# Patient Record
Sex: Male | Born: 2000 | Race: White | Hispanic: No | Marital: Single | Smoking: Never smoker
Health system: Southern US, Community
[De-identification: ages and names within clinical notes are randomized; demographics above are authoritative.]

## PROBLEM LIST (undated history)

## (undated) DIAGNOSIS — J45909 Unspecified asthma, uncomplicated: Secondary | ICD-10-CM

## (undated) DIAGNOSIS — F32A Depression, unspecified: Secondary | ICD-10-CM

## (undated) DIAGNOSIS — F913 Oppositional defiant disorder: Secondary | ICD-10-CM

## (undated) DIAGNOSIS — F909 Attention-deficit hyperactivity disorder, unspecified type: Secondary | ICD-10-CM

---

## 2020-04-15 ENCOUNTER — Ambulatory Visit: Payer: Medicaid Other | Attending: Internal Medicine

## 2020-04-15 DIAGNOSIS — Z23 Encounter for immunization: Secondary | ICD-10-CM

## 2020-04-15 NOTE — Progress Notes (Signed)
   Covid-19 Vaccination Clinic  Name:  Derrick Nguyen    MRN: [id number] DOB: 11-06-2001  04/15/2020  [name] was observed post Covid-19 immunization for 15 minutes without incident. He was provided with Vaccine Information Sheet and instruction to access the V-Safe system.   [name] was instructed to call 911 with any severe reactions post vaccine: Marland Kitchen Difficulty breathing  . Swelling of face and throat  . A fast heartbeat  . A bad rash all over body  . Dizziness and weakness   Immunizations Administered    Name Date Dose VIS Date Route   Pfizer COVID-19 Vaccine 04/15/2020  9:36 AM 0.3 mL 02/18/2019 Intramuscular   Manufacturer: [employer], [employer]   Lot: [id number]   NDC: [address]-4

## 2020-05-10 ENCOUNTER — Ambulatory Visit: Payer: Medicaid Other | Attending: Internal Medicine

## 2020-05-10 DIAGNOSIS — Z23 Encounter for immunization: Secondary | ICD-10-CM

## 2020-05-10 NOTE — Progress Notes (Signed)
   Covid-19 Vaccination Clinic  Name:  Derrick Nguyen    MRN: [id number] DOB: 2001-02-24  05/10/2020  [name] was observed post Covid-19 immunization for 15 minutes without incident. He was provided with Vaccine Information Sheet and instruction to access the V-Safe system.   [name] was instructed to call 911 with any severe reactions post vaccine: Marland Kitchen Difficulty breathing  . Swelling of face and throat  . A fast heartbeat  . A bad rash all over body  . Dizziness and weakness   Immunizations Administered    Name Date Dose VIS Date Route   Pfizer COVID-19 Vaccine 05/10/2020 10:40 AM 0.3 mL 02/18/2019 Intramuscular   Manufacturer: [employer], [employer]   Lot: [id number]   NDC: [address]-7

## 2020-06-13 ENCOUNTER — Ambulatory Visit (HOSPITAL_COMMUNITY)
Admission: EM | Admit: 2020-06-13 | Discharge: 2020-06-14 | Disposition: A | Discharge status: Home / Self Care | Payer: No Typology Code available for payment source | Attending: Psychiatry | Admitting: Psychiatry

## 2020-06-13 ENCOUNTER — Other Ambulatory Visit: Payer: Self-pay

## 2020-06-13 DIAGNOSIS — F909 Attention-deficit hyperactivity disorder, unspecified type: Secondary | ICD-10-CM | POA: Insufficient documentation

## 2020-06-13 DIAGNOSIS — F70 Mild intellectual disabilities: Secondary | ICD-10-CM | POA: Diagnosis not present

## 2020-06-13 DIAGNOSIS — Z79899 Other long term (current) drug therapy: Secondary | ICD-10-CM | POA: Insufficient documentation

## 2020-06-13 DIAGNOSIS — F329 Major depressive disorder, single episode, unspecified: Secondary | ICD-10-CM | POA: Insufficient documentation

## 2020-06-13 DIAGNOSIS — F913 Oppositional defiant disorder: Secondary | ICD-10-CM

## 2020-06-13 DIAGNOSIS — Z20822 Contact with and (suspected) exposure to covid-19: Secondary | ICD-10-CM | POA: Insufficient documentation

## 2020-06-13 DIAGNOSIS — J45909 Unspecified asthma, uncomplicated: Secondary | ICD-10-CM | POA: Insufficient documentation

## 2020-06-13 DIAGNOSIS — F840 Autistic disorder: Secondary | ICD-10-CM | POA: Diagnosis not present

## 2020-06-13 HISTORY — DX: Depression, unspecified: F32.A

## 2020-06-13 HISTORY — DX: Oppositional defiant disorder: F91.3

## 2020-06-13 HISTORY — DX: Unspecified asthma, uncomplicated: J45.909

## 2020-06-13 HISTORY — DX: Attention-deficit hyperactivity disorder, unspecified type: F90.9

## 2020-06-13 LAB — POCT URINE DRUG SCREEN - MANUAL ENTRY (I-SCREEN)
POC Amphetamine UR: NOT DETECTED
POC Buprenorphine (BUP): NOT DETECTED
POC Cocaine UR: NOT DETECTED
POC Marijuana UR: NOT DETECTED
POC Methadone UR: NOT DETECTED
POC Methamphetamine UR: NOT DETECTED
POC Morphine: NOT DETECTED
POC Oxazepam (BZO): NOT DETECTED
POC Oxycodone UR: NOT DETECTED
POC Secobarbital (BAR): NOT DETECTED

## 2020-06-13 MED ORDER — DIVALPROEX SODIUM ER 500 MG PO TB24
500.0000 mg | ORAL_TABLET | Freq: Two times a day (BID) | ORAL | Status: DC
  Administered 2020-06-14: 500 mg via ORAL
  Filled 2020-06-13: qty 1

## 2020-06-13 MED ORDER — MAGNESIUM HYDROXIDE 400 MG/5ML PO SUSP: 30.0000 mL | Freq: Every day | ORAL | Status: DC | PRN

## 2020-06-13 MED ORDER — MONTELUKAST SODIUM 10 MG PO TABS
10.0000 mg | ORAL_TABLET | Freq: Every day | ORAL | Status: DC
  Administered 2020-06-14: 10 mg via ORAL
  Filled 2020-06-13: qty 1

## 2020-06-13 MED ORDER — ESCITALOPRAM OXALATE 10 MG PO TABS
20.0000 mg | ORAL_TABLET | Freq: Every day | ORAL | Status: DC
  Administered 2020-06-14: 20 mg via ORAL
  Filled 2020-06-13: qty 2

## 2020-06-13 MED ORDER — OLANZAPINE 5 MG PO TABS: 5.0000 mg | ORAL_TABLET | Freq: Two times a day (BID) | ORAL | Status: DC | PRN

## 2020-06-13 MED ORDER — ACETAMINOPHEN 325 MG PO TABS: 650.0000 mg | ORAL_TABLET | Freq: Four times a day (QID) | ORAL | Status: DC | PRN

## 2020-06-13 MED ORDER — ALUM & MAG HYDROXIDE-SIMETH 200-200-20 MG/5ML PO SUSP: 30.0000 mL | ORAL | Status: DC | PRN

## 2020-06-13 MED ORDER — HYDROXYZINE HCL 25 MG PO TABS: 25.0000 mg | ORAL_TABLET | Freq: Every day | ORAL | Status: DC

## 2020-06-13 MED ORDER — ALBUTEROL SULFATE HFA 108 (90 BASE) MCG/ACT IN AERS: 2.0000 | INHALATION_SPRAY | Freq: Four times a day (QID) | RESPIRATORY_TRACT | Status: DC | PRN

## 2020-06-13 MED ORDER — ARIPIPRAZOLE 15 MG PO TABS
15.0000 mg | ORAL_TABLET | Freq: Every day | ORAL | Status: DC
  Administered 2020-06-14: 15 mg via ORAL
  Filled 2020-06-13: qty 1

## 2020-06-13 MED ORDER — TRAZODONE HCL 50 MG PO TABS: 50.0000 mg | ORAL_TABLET | Freq: Every day | ORAL | Status: DC

## 2020-06-13 NOTE — BH Assessment (Addendum)
Comprehensive Clinical Assessment (CCA) Note  06/13/2020 Derrick Nguyen [id number]  Pt presents to [facility] voluntarily via law enforcement and accompanied by his caregiver, Derrick Nguyen [phone]. Pt has documented diagnosis of Autism Spectrum Disorder, Mild Intellectual Disability, ADHD and ODD. Pt reports he became upset today because he wrote a letter to his family that included critical comments about certain family member and Pt's grandmother told him the letter "was mean." He says he became angry and punched a window with his fist, shattering it. He vacillates regarding suicidal ideation, first denying suicidal thoughts then saying he is experiencing suicidal thoughts. He reports he has attempted suicide in the past by trying to jump from a third story window. He denies current thoughts of harming others. He denies auditory or visual hallucinations but does report having an imaginary friend. He denies alcohol or other substance use.  [name] reports Pt becomes angry very quickly. He says today Pt was upset about the situation with writing the letter and because of Father's day. He reports Pt was angry and refused to take medication. He says Pt punched the window and the hand a piece of broken glass in his hand and lunged at [name] trying to cut him. [name] reports Pt also threatened to cut himself. [name] states he called 911 and law enforcement arrive. [name] says Pt calmed down and law enforcement left. Pt then stated that he didn't feel right and was having thoughts of harming Dr. Zachery Dauer and himself.  Pt lives with caregiver, [name], and there are no others in residence. Pt's father, Nycere Nguyen, is Pt's legal guardian. Pt receives outpatient mental health treatment through [facility]. He has been admitted to [facility] unit at [facility] in 2016 and 2018.  Additional clinical information is in "Individual Behavior Support Plan" scanned under Media tab.  Pt is  casually dressed, alert and oriented x4. Pt speaks in a clear tone, at moderate volume and normal pace. Motor behavior appears normal. Eye contact is good. Pt's mood is anxious and affect is congruent with mood. Thought process is coherent and relevant. There is no indication Pt is currently responding to internal stimuli or experiencing delusional thought content. Pt was pleasant and cooperative throughout assessment.   Visit Diagnosis:      ICD-10-CM   1. Oppositional defiant disorder  F91.3   2. Autism spectrum disorder  F84.0       DISPOSITION: GAVE CLINICAL REPORT TO Derrick Conn, FNP WHO COMPLETED MSE AND RECOMMENDED PT BE PLACED IN CONTINUOUS ASSESSMENT, OBSERVED OVERNIGHT, AND RE-EVALUATED IN THE MORNING. NOTIFIED PT'S CAREGIVER Derrick BARNES JR. LEFT HIPAA-COMPLIANT VOICEMAIL FOR PT'S FATHER/LEGAL GUARDIAN, Derrick Nguyen, NOTIFYING HIM OF RECOMMENDATION.  PHQ9 SCORE ONLY 06/13/2020  PHQ-9 Total Score 7    CCA Screening, Triage and Referral (STR)  Patient Reported Information How did you hear about [place]? Other (Comment) Mudlogger)  Referral name: No data recorded Referral phone number: No data recorded  Whom do you see for routine medical problems? Hospital ER  Practice/Facility Name: No data recorded Practice/Facility Phone Number: No data recorded Name of Contact: No data recorded Contact Number: No data recorded Contact Fax Number: No data recorded Prescriber Name: No data recorded Prescriber Address (if known): No data recorded  What Is the Reason for Your Visit/Call Today? No data recorded How Long Has This Been Causing You Problems? <Week  What Do You Feel Would Help You the Most Today? Assessment Only   Have You  Recently Been in Any Inpatient Treatment (Hospital/Detox/Crisis Center/28-Day Program)? No  Name/Location of Program/Hospital:No data recorded How Long Were You There? No data recorded When Were You Discharged? No data recorded  Have You Ever  Received Services From [facility] Before? No  Who Do You See at [facility]? No data recorded  Have You Recently Had Any Thoughts About Hurting Yourself? Yes  Are You Planning to Commit Suicide/Harm Yourself At This time? No   Have you Recently Had Thoughts About [place]? Yes  Explanation: No data recorded  Have You Used Any Alcohol or Drugs in the Past 24 Hours? No  How Long Ago Did You Use Drugs or Alcohol? No data recorded What Did You Use and How Much? No data recorded  Do You Currently Have a Therapist/Psychiatrist? Yes  Name of Therapist/Psychiatrist: [facility]   Have You Been Recently Discharged From Any Office Practice or Programs? No  Explanation of Discharge From Practice/Program: No data recorded    CCA Screening Triage Referral Assessment Type of Contact: Face-to-Face  Is this Initial or Reassessment? No data recorded Date Telepsych consult ordered in [facility]:  No data recorded Time Telepsych consult ordered in [facility]:  No data recorded  Patient Reported Information Reviewed? Yes  Patient Left Without Being Seen? No data recorded Reason for Not Completing Assessment: No data recorded  Collateral Involvement: Caregivers: Alver Fisher [phone]   Does Patient Have a Court Appointed Legal Guardian? No data recorded Name and Contact of Legal Guardian: No data recorded If Minor and Not Living with Parent(s), Who has Custody? No data recorded Is CPS involved or ever been involved? Never  Is APS involved or ever been involved? Never   Patient Determined To Be At Risk for Harm To Self or Others Based on Review of Patient Reported Information or Presenting Complaint? Yes, for Self-Harm  Method: Plan without intent (Cut himself with broken glass)  Availability of Means: In hand or used  Intent: Vague intent or NA  Notification Required: No need or identified person  Additional Information for Danger to Others Potential: Previous  attempts  Additional Comments for Danger to Others Potential: Pt put fist through window today and threatened to harm himself and caregiver with broken glass  Are There Guns or Other Weapons in Your Home? No  Types of Guns/Weapons: No data recorded Are These Weapons Safely Secured?                            No data recorded Who Could Verify You Are Able To Have These Secured: No data recorded Do You Have any Outstanding Charges, Pending Court Dates, Parole/Probation? None  Contacted To Inform of Risk of Harm To Self or Others: Law Enforcement;Guardian/MH POA:   Location of Assessment: [facility]   Does Patient Present under Involuntary Commitment? No  IVC Papers Initial File Date: No data recorded  [place] of Residence: [place]   Patient Currently Receiving the Following Services: Medication Management;Individual Therapy   Determination of Need: Emergent (2 hours)   Options For Referral: Inpatient Hospitalization;Outpatient Therapy     CCA Biopsychosocial  Intake/Chief Complaint:  CCA Intake With Chief Complaint CCA Part Two Date: 06/13/20 CCA Part Two Time: 2302 Chief Complaint/Presenting Problem: Pt reports thoughts of harming himself. Threatened to harm caregiver today Patient's Currently Reported Symptoms/Problems: Agner outbursts, frustration Individual's Strengths: Pt has life goals. Individual's Preferences: Enjoys visits with family Individual's Abilities: Writing  letters, gardening Type of Services Patient Feels Are Needed: Pt states he does not know  Mental Health Symptoms Depression:  Depression: Change in energy/activity, Irritability, Duration of symptoms greater than two weeks  Mania:  Mania: Irritability, Recklessness  Anxiety:   Anxiety: Irritability, Restlessness, Tension, Worrying  Psychosis:  Psychosis: None  Trauma:  Trauma: None  Obsessions:  Obsessions: None  Compulsions:  Compulsions: None  Inattention:  Inattention:  Symptoms before age 6, Avoids/dislikes activities that require focus  Hyperactivity/Impulsivity:  Hyperactivity/Impulsivity: Feeling of restlessness  Oppositional/Defiant Behaviors:  Oppositional/Defiant Behaviors: Aggression towards people/animals, Angry, Easily annoyed, Temper  Emotional Irregularity:  Emotional Irregularity: Intense/inappropriate anger, Mood lability, Potentially harmful impulsivity, Recurrent suicidal behaviors/gestures/threats  Other Mood/Personality Symptoms:      Mental Status Exam Appearance and self-care  Stature:  Stature: Average  Weight:  Weight: Average weight  Clothing:  Clothing: Casual  Grooming:  Grooming: Normal  Cosmetic use:  Cosmetic Use: None  Posture/gait:  Posture/Gait: Normal  Motor activity:  Motor Activity: Not Remarkable  Sensorium  Attention:  Attention: Normal  Concentration:  Concentration: Normal  Orientation:  Orientation: X5  Recall/memory:  Recall/Memory: Normal  Affect and Mood  Affect:  Affect: Anxious  Mood:  Mood: Anxious  Relating  Eye contact:  Eye Contact: Normal  Facial expression:  Facial Expression: Responsive  Attitude toward examiner:  Attitude Toward Examiner: Cooperative  Thought and Language  Speech flow: Speech Flow: Normal  Thought content:  Thought Content: Appropriate to Mood and Circumstances  Preoccupation:  Preoccupations: None  Hallucinations:  Hallucinations: None  Organization:     Company secretary of Knowledge:  Fund of Knowledge: Impoverished by (Comment)  Intelligence:  Intelligence: Below average  Abstraction:  Abstraction: Concrete  Judgement:  Judgement: Impaired  Reality Testing:  Reality Testing: Realistic  Insight:  Insight: Lacking  Decision Making:  Decision Making: Impulsive  Social Functioning  Social Maturity:  Social Maturity: Irresponsible  Social Judgement:  Social Judgement: Impropriety, Naive  Stress  Stressors:  Stressors: Family conflict, Grief/losses  Coping  Ability:  Coping Ability: Deficient supports  Skill Deficits:  Skill Deficits: Intellect/education  Supports:  Supports: Family     Religion: Religion/Spirituality Are You A Religious Person?: No  Leisure/Recreation: Leisure / Recreation Do You Have Hobbies?: Yes Leisure and Hobbies: Company secretary, going out to eat  Exercise/Diet: Exercise/Diet Do You Exercise?: No Have You Gained or Lost A Significant Amount of Weight in the Past Six Months?: No Do You Follow a Special Diet?: No Do You Have Any Trouble Sleeping?: No   CCA Employment/Education  Employment/Work Situation: Employment / Work Situation Employment situation: On disability Why is patient on disability: Autism, IDD How long has patient been on disability: Age 65 What is the longest time patient has a held a job?: NA Where was the patient employed at that time?: NA Has patient ever been in the [employer]?: No  Education: Education Is Patient Currently Attending School?: No Did Garment/textile technologist From [employer]?: No Did You Product manager?: No Did Designer, television/film set?: No Did You Have Any Special Interests In School?: NA Did You Have An Individualized Education Program (IIEP): Yes Did You Have Any Difficulty At School?: Yes Were Any Medications Ever Prescribed For These Difficulties?: Yes Patient's Education Has Been Impacted by Current Illness: Yes How Does Current Illness Impact Education?: IDD   CCA Family/Childhood History  Family and Relationship History: Family history Marital status: Single Are you sexually active?: No Does patient have children?: No  Childhood History:  Childhood History By whom was/is the patient raised?: Foster parents Additional childhood history information: Lived in therapeutic foster care and [facility]CF as a younger child Description of patient's relationship with caregiver when they were a child: NA Patient's description of current relationship with people who raised  him/her: Pt enjoys contact with family How were you disciplined when you got in trouble as a child/adolescent?: time-out Did patient suffer any verbal/emotional/physical/sexual abuse as a child?: No Did patient suffer from severe childhood neglect?: No Has patient ever been sexually abused/assaulted/raped as an adolescent or adult?: No Was the patient ever a victim of a crime or a disaster?: No Witnessed domestic violence?: No Has patient been affected by domestic violence as an adult?: No  Child/Adolescent Assessment:     CCA Substance Use  Alcohol/Drug Use: Alcohol / Drug Use Pain Medications: Denies abuse Prescriptions: Denies abuse Over the Counter: Denies abuse History of alcohol / drug use?: No history of alcohol / drug abuse Longest period of sobriety (when/how long): NA                         ASAM's:  Six Dimensions of Multidimensional Assessment  Dimension 1:  Acute Intoxication and/or Withdrawal Potential:      Dimension 2:  Biomedical Conditions and Complications:      Dimension 3:  Emotional, Behavioral, or Cognitive Conditions and Complications:     Dimension 4:  Readiness to Change:     Dimension 5:  Relapse, Continued use, or Continued Problem Potential:     Dimension 6:  Recovery/Living Environment:     ASAM Severity Score:    ASAM Recommended Level of Treatment:     Substance use Disorder (SUD)    Recommendations for Services/Supports/Treatments:    DSM5 Diagnoses: There are no problems to display for this patient.   Patient Centered Plan: Patient is on the following Treatment Plan(s):     Referrals to Alternative Service(s): Referred to Alternative Service(s):   Place:   Date:   Time:    Referred to Alternative Service(s):   Place:   Date:   Time:    Referred to Alternative Service(s):   Place:   Date:   Time:    Referred to Alternative Service(s):   Place:   Date:   Time:     Pamalee Leyden, [facility], [facility] Triage  Specialist [phone]  Patsy [place], Harlin Rain
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
## 2020-06-14 ENCOUNTER — Encounter (HOSPITAL_COMMUNITY): Payer: Self-pay | Admitting: Nurse Practitioner

## 2020-06-14 ENCOUNTER — Other Ambulatory Visit: Payer: Self-pay

## 2020-06-14 LAB — CBC WITH DIFFERENTIAL/PLATELET
Abs Immature Granulocytes: 0.02 10*3/uL (ref 0.00–0.07)
Basophils Absolute: 0.1 10*3/uL (ref 0.0–0.1)
Basophils Relative: 1 %
Eosinophils Absolute: 0 10*3/uL (ref 0.0–0.5)
Eosinophils Relative: 0 %
HCT: 42.8 % (ref 39.0–52.0)
Hemoglobin: 14.5 g/dL (ref 13.0–17.0)
Immature Granulocytes: 0 %
Lymphocytes Relative: 20 %
Lymphs Abs: 1.6 10*3/uL (ref 0.7–4.0)
MCH: 31.3 pg (ref 26.0–34.0)
MCHC: 33.9 g/dL (ref 30.0–36.0)
MCV: 92.4 fL (ref 80.0–100.0)
Monocytes Absolute: 0.6 10*3/uL (ref 0.1–1.0)
Monocytes Relative: 8 %
Neutro Abs: 5.8 10*3/uL (ref 1.7–7.7)
Neutrophils Relative %: 71 %
Platelets: 186 10*3/uL (ref 150–400)
RBC: 4.63 MIL/uL (ref 4.22–5.81)
RDW: 12.1 % (ref 11.5–15.5)
WBC: 8.1 10*3/uL (ref 4.0–10.5)
nRBC: 0 % (ref 0.0–0.2)

## 2020-06-14 LAB — COMPREHENSIVE METABOLIC PANEL
ALT: 13 U/L (ref 0–44)
AST: 15 U/L (ref 15–41)
Albumin: 4.4 g/dL (ref 3.5–5.0)
Alkaline Phosphatase: 49 U/L (ref 38–126)
Anion gap: 12 (ref 5–15)
BUN: 13 mg/dL (ref 6–20)
CO2: 26 mmol/L (ref 22–32)
Calcium: 9.6 mg/dL (ref 8.9–10.3)
Chloride: 102 mmol/L (ref 98–111)
Creatinine, Ser: 0.67 mg/dL (ref 0.61–1.24)
GFR calc Af Amer: >60 mL/min (ref >60)
GFR calc non Af Amer: >60 mL/min (ref >60)
Glucose, Bld: 87 mg/dL (ref 70–99)
Potassium: 3.7 mmol/L (ref 3.5–5.1)
Sodium: 140 mmol/L (ref 135–145)
Total Bilirubin: 0.9 mg/dL (ref 0.3–1.2)
Total Protein: 7 g/dL (ref 6.5–8.1)

## 2020-06-14 LAB — POC SARS CORONAVIRUS 2 AG: SARS Coronavirus 2 Ag: NEGATIVE

## 2020-06-14 NOTE — ED Notes (Signed)
Pt sleeping, resting comfortably at present.  No distress noted, Monitoring for safety.

## 2020-06-14 NOTE — ED Notes (Signed)
Pt sleeping at present, no distress noted, calm & cooperative.  Monitoring for safety. 

## 2020-06-14 NOTE — ED Provider Notes (Signed)
[facility]/OBS ASAP Discharge Summary  Date and Time: 06/14/2020 12:23 PM  Name: Derrick Nguyen  MRN:  [id number]   Discharge Diagnoses:  Final diagnoses:  Autism spectrum disorder  Oppositional defiant disorder    Subjective: Patient reports that he is doing better today.  He states that yesterday he just got upset and refused to take his medications even though he knows that he should have taken them.  He denies any suicidal or homicidal ideations and denies any hallucinations.  Patient reports that he feels much calmer today and is ready to go home with his caregiver, Mr. Derrick Nguyen.  Stay Summary: Patient presented to [facility] voluntarily via law enforcement and accompanied by his caregiver, Derrick Nguyen.  Patient came in because he had became upset yesterday and police were called out and patient calm down.  Patient then stated that he had thoughts of harming himself and his as well as harming his caregiver and was brought in for evaluation.  Patient remained in the behavioral health unit under continuous observation and remained calm and cooperative.  Patient has a known history of ASD, IDD, ADHD, and ODD.  Collateral was gained from patient's caregiver and he reported that the patient does have a history of becoming agitated and aggressive but has never made comments about harming himself or anyone else and that is why he was brought in for evaluation.  Patient's caregiver reports that he feels safe taking him home since the patient has remained calm and has not caused any issues.  He states that he will be here around 11 AM.  He reports that the patient also has his medications at home.  He reports that the patient's father, Derrick Nguyen, is his legal guardian and he can be reached at [phone].  Derrick Nguyen was contacted and provided information about dispo and he stated he had no concerns for the patient being discharged.  Total Time spent with patient: 30 minutes  Past Psychiatric History:  ASD, IDD, ADHD, and ODD.  He also has reported history of aggression due to his disabilities. Past Medical History:  Past Medical History:  Diagnosis Date   ADHD (attention deficit hyperactivity disorder)    Asthma    Depression    Oppositional defiant disorder    History reviewed. No pertinent surgical history. Family History:  Family History  Family history unknown: Yes   Family Psychiatric History: None reported Social History:  Social History   Substance and Sexual Activity  Alcohol Use Never     Social History   Substance and Sexual Activity  Drug Use Never    Social History   Socioeconomic History   Marital status: Single    Spouse name: Not on file   Number of children: Not on file   Years of education: Not on file   Highest education level: Not on file  Occupational History   Not on file  Tobacco Use   Smoking status: Never Smoker   Smokeless tobacco: Never Used  Substance and Sexual Activity   Alcohol use: Never   Drug use: Never   Sexual activity: Not on file  Other Topics Concern   Not on file  Social History Narrative   Not on file   Social Determinants of Health   Financial Resource Strain:    Difficulty of Paying Living Expenses:   Food Insecurity:    Worried About [place] in the Last Year:    [place] in the Last Year:  Transportation Needs:    Freight forwarder (Medical):    Lack of Transportation (Non-Medical):   Physical Activity:    Days of Exercise per Week:    Minutes of Exercise per Session:   Stress:    Feeling of Stress :   Social Connections:    Frequency of Communication with Friends and Family:    Frequency of Social Gatherings with Friends and Family:    Attends Religious Services:    Active Member of Clubs or Organizations:    Attends Engineer, structural:    Marital Status:    SDOH:  SDOH Screenings   Alcohol Screen:    Last Alcohol Screening Score  (AUDIT):   Depression (PHQ2-9): Medium Risk   PHQ-2 Score: 7  Financial Resource Strain:    Difficulty of Paying Living Expenses:   Food Insecurity:    Worried About Programme researcher, broadcasting/film/video in the Last Year:    Barista in the Last Year:   Housing:    Last Housing Risk Score:   Physical Activity:    Days of Exercise per Week:    Minutes of Exercise per Session:   Social Connections:    Frequency of Communication with Friends and Family:    Frequency of Social Gatherings with Friends and Family:    Attends Religious Services:    Active Member of Clubs or Organizations:    Attends Engineer, structural:    Marital Status:   Stress:    Feeling of Stress :   Tobacco Use: Low Risk    Smoking Tobacco Use: Never Smoker   Smokeless Tobacco Use: Never Used  Transportation Needs:    Freight forwarder (Medical):    Lack of Transportation (Non-Medical):     Has this patient used any form of tobacco in the last 30 days? (Cigarettes, Smokeless Tobacco, Cigars, and/or Pipes) Prescription not provided because: Patient does not smoke  Current Medications:  Current Facility-Administered Medications  Medication Dose Route Frequency Provider Last Rate Last Admin   acetaminophen (TYLENOL) tablet 650 mg  650 mg Oral Q6H PRN [name] A, NP       albuterol (VENTOLIN HFA) 108 (90 Base) MCG/ACT inhaler 2 puff  2 puff Inhalation Q6H PRN [name] A, NP       alum & mag hydroxide-simeth (MAALOX/MYLANTA) 200-200-20 MG/5ML suspension 30 mL  30 mL Oral Q4H PRN [name] A, NP       ARIPiprazole (ABILIFY) tablet 15 mg  15 mg Oral Daily [name] A, NP   15 mg at 06/14/20 1006   divalproex (DEPAKOTE ER) 24 hr tablet 500 mg  500 mg Oral BID [name] A, NP   500 mg at 06/14/20 1006   escitalopram (LEXAPRO) tablet 20 mg  20 mg Oral Daily [name] A, NP   20 mg at 06/14/20 1007   hydrOXYzine (ATARAX/VISTARIL) tablet 25 mg  25 mg Oral QHS [name]  A, NP       magnesium hydroxide (MILK OF MAGNESIA) suspension 30 mL  30 mL Oral Daily PRN [name] A, NP       montelukast (SINGULAIR) tablet 10 mg  10 mg Oral Daily [name] A, NP   10 mg at 06/14/20 1007   OLANZapine (ZYPREXA) tablet 5 mg  5 mg Oral BID PRN Jackelyn Poling, NP       traZODone (DESYREL) tablet 50 mg  50 mg Oral QHS Jackelyn Poling, NP  Current Outpatient Medications  Medication Sig Dispense Refill   hydrOXYzine (VISTARIL) 25 MG capsule Take 25 mg by mouth at bedtime.     ketoconazole (NIZORAL) 2 % shampoo Apply 1 application topically once a week.     montelukast (SINGULAIR) 10 MG tablet Take 10 mg by mouth daily.     OLANZapine (ZYPREXA) 5 MG tablet Take 5 mg by mouth 2 (two) times daily as needed.     traZODone (DESYREL) 50 MG tablet Take 50 mg by mouth at bedtime.     albuterol (VENTOLIN HFA) 108 (90 Base) MCG/ACT inhaler Inhale 2 puffs into the lungs every 6 (six) hours as needed.     ARIPiprazole (ABILIFY) 15 MG tablet Take 15 mg by mouth daily.     divalproex (DEPAKOTE ER) 500 MG 24 hr tablet Take 500 mg by mouth 2 (two) times daily.     escitalopram (LEXAPRO) 20 MG tablet Take 20 mg by mouth daily.     LORazepam (ATIVAN) 1 MG tablet Take 1 mg by mouth 3 (three) times daily.      PTA Medications: (Not in a hospital admission)   Musculoskeletal  Strength & Muscle Tone: within normal limits Gait & Station: normal Patient leans: N/A  Psychiatric Specialty Exam  Presentation  General Appearance: Casual  Eye Contact:Fair  Speech:Clear and Coherent;Slow  Speech Volume:Decreased  Handedness:Right   Mood and Affect  Mood:Anxious  Affect:Appropriate   Thought Process  Thought Processes:Coherent  Descriptions of Associations:Intact  Orientation:Full (Time, Place and Person)  Thought Content:WDL  Hallucinations:Hallucinations: None  Ideas of Reference:None  Suicidal Thoughts:Suicidal Thoughts: No  Homicidal  Thoughts:Homicidal Thoughts: No   Sensorium  Memory:Immediate Good;Recent Good  Judgment:Fair  Insight:Fair   Executive Functions  Concentration:Fair  Attention Span:Fair  Recall:Fair  Fund of Knowledge:Poor  Language:Fair   Psychomotor Activity  Psychomotor Activity:Psychomotor Activity: Normal   Assets  Assets:Desire for Improvement;Financial Resources/Insurance;Housing;Transportation;Social Support   Sleep  Sleep:Sleep: Good   Physical Exam  Physical Exam Vitals and nursing note reviewed.  Constitutional:      Appearance: He is well-developed.  Cardiovascular:     Rate and Rhythm: Normal rate.  Pulmonary:     Effort: Pulmonary effort is normal.  Musculoskeletal:        General: Normal range of motion.  Skin:    General: Skin is warm.  Neurological:     Mental Status: He is alert and oriented to person, place, and time.    Review of Systems  Constitutional: Negative.   HENT: Negative.   Eyes: Negative.   Respiratory: Negative.   Cardiovascular: Negative.   Gastrointestinal: Negative.   Genitourinary: Negative.   Musculoskeletal: Negative.   Skin: Negative.   Neurological: Negative.   Endo/Heme/Allergies: Negative.   Psychiatric/Behavioral: Negative.    Blood pressure 136/66, pulse 72, temperature (!) 96.6 F (35.9 C), temperature source Tympanic, resp. rate 20, height 6\' 1"  (1.854 m), weight 198 lb (89.8 kg), SpO2 100 %. Body mass index is 26.12 kg/m.  Demographic Factors:  Male and Caucasian  Loss Factors: NA  Historical Factors: Impulsivity  Risk Reduction Factors:   Living with another person, especially a relative, Positive social support, Positive therapeutic relationship and Positive coping skills or problem solving skills  Continued Clinical Symptoms:  Severe Anxiety and/or Agitation  Cognitive Features That Contribute To Risk:  Loss of executive function    Suicide Risk:  Minimal: No identifiable suicidal ideation.   Patients presenting with no risk factors but with morbid ruminations; may be classified  as minimal risk based on the severity of the depressive symptoms  Plan Of Care/Follow-up recommendations:  Continue activity as tolerated. Continue diet as recommended by your PCP. Ensure to keep all appointments with outpatient providers.  Disposition: Discharge home with caregiver. Legal Guardian, Father, contacted about disposition  Maryfrances Bunnell, FNP 06/14/2020, 12:23 PM

## 2020-06-14 NOTE — ED Notes (Signed)
His care giver arrived to transport him home at 1120 hrs.He was escorted to the front door, received his AVS and his questions answered.

## 2020-06-14 NOTE — ED Triage Notes (Signed)
Presents with complaint of SI thoughts.

## 2020-06-14 NOTE — ED Notes (Signed)
Received [place] this AM at the change of shift asleep in his bed, he woke up to use the bathroom and talk with the NP. He denied feeling suicidal and homicidal. He feels ready to go home and apologizie to his caretaker.

## 2020-06-14 NOTE — Discharge Instructions (Addendum)
Continue current medications Follow up with current providers
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
## 2020-06-14 NOTE — ED Notes (Signed)
Derrick Nguyen received fluids and a salad per his request.

## 2020-07-07 ENCOUNTER — Ambulatory Visit (HOSPITAL_COMMUNITY)
Admission: EM | Admit: 2020-07-07 | Discharge: 2020-07-07 | Disposition: A | Discharge status: Home / Self Care | Payer: No Typology Code available for payment source | Attending: Psychiatry | Admitting: Psychiatry

## 2020-07-07 ENCOUNTER — Other Ambulatory Visit: Payer: Self-pay

## 2020-07-07 DIAGNOSIS — F840 Autistic disorder: Secondary | ICD-10-CM | POA: Insufficient documentation

## 2020-07-07 DIAGNOSIS — F418 Other specified anxiety disorders: Secondary | ICD-10-CM | POA: Insufficient documentation

## 2020-07-07 DIAGNOSIS — R440 Auditory hallucinations: Secondary | ICD-10-CM | POA: Insufficient documentation

## 2020-07-07 DIAGNOSIS — F913 Oppositional defiant disorder: Secondary | ICD-10-CM | POA: Insufficient documentation
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
## 2020-07-07 NOTE — ED Provider Notes (Addendum)
Behavioral Health Medical Screening Exam  Derrick Nguyen is a 19 y.o. male with a history of autism spectrum disorder and oppositional defiant disorder who presents to [facility] voluntarily with his [facility] provider for SI. Pt reports he has suicidal thoughts of banging his head on the wall earlier today. Pt denies current SI, HI, SH and prior SA. Pt endorses hearing voices saying "kill yourself" for a long time. Pt was last assessed and observed overnight at [facility] on 6/21. Pt states he can contract for safety. Pt has a team meeting appointment with pt's therapist and psychiatrist concerning his depression, anxiety auditory hallucination. Pt caretaker Derrick Nguyen states he can also contract for safety.   Total Time spent with patient: 30 minutes  Psychiatric Specialty Exam  Presentation  General Appearance:Appropriate for Environment;Casual  Eye Contact:Fair  Speech:Normal Rate  Speech Volume:Normal  Handedness:Right   Mood and Affect  Mood:Anxious  Affect:Congruent   Thought Process  Thought Processes:Coherent  Descriptions of Associations:Intact  Orientation:Full (Time, Place and Person)  Thought Content:Logical  Hallucinations:None  Ideas of Reference:None  Suicidal Thoughts:No  Homicidal Thoughts:No   Sensorium  Memory:Immediate Fair;Recent Fair  Judgment:Fair  Insight:Fair   Executive Functions  Concentration:Fair  Attention Span:Fair  Recall:Fair  Fund of Knowledge:Good  Language:Fair   Psychomotor Activity  Psychomotor Activity:Normal   Assets  Assets:Communication Skills;Desire for Improvement;Financial Resources/Insurance;Housing;Social Support   Sleep  Sleep:Good  Number of hours: 8   Physical Exam: Physical Exam ROS Blood pressure 139/79, pulse 92, temperature 98.2 F (36.8 C), temperature source Oral, resp. rate 18, SpO2 100 %. There is no height or weight on file to calculate BMI.  Musculoskeletal: Strength & Muscle Tone:  within normal limits Gait & Station: normal Patient leans: N/A   Recommendations:  Based on my evaluation the patient does not appear to have an emergency medical condition.   Disposition: No evidence of imminent risk to self or others at present.   Patient does not meet criteria for psychiatric inpatient admission. Supportive therapy provided about ongoing stressors. Discussed crisis plan, support from social network, calling 911, coming to the Emergency Department, and calling Suicide Hotline.  Wandra Arthurs, NP 07/07/2020, 8:25 PM

## 2020-07-07 NOTE — Discharge Instructions (Signed)
Follow up with Derrick Nguyen for outpatient therapy and [facility] for medication management. Patient has treatment team meeting on 07/08/2020 with healthcare providers.

## 2020-07-08 ENCOUNTER — Emergency Department (HOSPITAL_COMMUNITY)
Admission: EM | Admit: 2020-07-08 | Discharge: 2020-07-09 | Disposition: A | Discharge status: Home / Self Care | Payer: No Typology Code available for payment source | Attending: Emergency Medicine | Admitting: Emergency Medicine

## 2020-07-08 ENCOUNTER — Ambulatory Visit (HOSPITAL_COMMUNITY)
Admission: RE | Admit: 2020-07-08 | Discharge: 2020-07-08 | Disposition: A | Discharge status: Home / Self Care | Payer: No Typology Code available for payment source | Attending: Psychiatry | Admitting: Psychiatry

## 2020-07-08 ENCOUNTER — Other Ambulatory Visit: Payer: Self-pay

## 2020-07-08 ENCOUNTER — Encounter (HOSPITAL_COMMUNITY): Payer: Self-pay

## 2020-07-08 DIAGNOSIS — Z20822 Contact with and (suspected) exposure to covid-19: Secondary | ICD-10-CM | POA: Diagnosis not present

## 2020-07-08 DIAGNOSIS — F840 Autistic disorder: Secondary | ICD-10-CM | POA: Diagnosis not present

## 2020-07-08 DIAGNOSIS — R45851 Suicidal ideations: Secondary | ICD-10-CM | POA: Insufficient documentation

## 2020-07-08 DIAGNOSIS — J45909 Unspecified asthma, uncomplicated: Secondary | ICD-10-CM | POA: Diagnosis not present

## 2020-07-08 LAB — ACETAMINOPHEN LEVEL: Acetaminophen (Tylenol), Serum: <10 ug/mL — ABNORMAL LOW (ref 10–30)

## 2020-07-08 LAB — CBC
HCT: 40.9 % (ref 39.0–52.0)
Hemoglobin: 14.2 g/dL (ref 13.0–17.0)
MCH: 32.3 pg (ref 26.0–34.0)
MCHC: 34.7 g/dL (ref 30.0–36.0)
MCV: 93.2 fL (ref 80.0–100.0)
Platelets: 173 10*3/uL (ref 150–400)
RBC: 4.39 MIL/uL (ref 4.22–5.81)
RDW: 12.1 % (ref 11.5–15.5)
WBC: 6.2 10*3/uL (ref 4.0–10.5)
nRBC: 0 % (ref 0.0–0.2)

## 2020-07-08 LAB — COMPREHENSIVE METABOLIC PANEL
ALT: 12 U/L (ref 0–44)
AST: 18 U/L (ref 15–41)
Albumin: 4.5 g/dL (ref 3.5–5.0)
Alkaline Phosphatase: 49 U/L (ref 38–126)
Anion gap: 12 (ref 5–15)
BUN: 15 mg/dL (ref 6–20)
CO2: 27 mmol/L (ref 22–32)
Calcium: 9.6 mg/dL (ref 8.9–10.3)
Chloride: 103 mmol/L (ref 98–111)
Creatinine, Ser: 0.63 mg/dL (ref 0.61–1.24)
GFR calc Af Amer: >60 mL/min (ref >60)
GFR calc non Af Amer: >60 mL/min (ref >60)
Glucose, Bld: 104 mg/dL — ABNORMAL HIGH (ref 70–99)
Potassium: 4.2 mmol/L (ref 3.5–5.1)
Sodium: 142 mmol/L (ref 135–145)
Total Bilirubin: 0.5 mg/dL (ref 0.3–1.2)
Total Protein: 7.8 g/dL (ref 6.5–8.1)

## 2020-07-08 LAB — SALICYLATE LEVEL: Salicylate Lvl: <7 mg/dL — ABNORMAL LOW (ref 7.0–30.0)

## 2020-07-08 LAB — ETHANOL: Alcohol, Ethyl (B): <10 mg/dL (ref <10)

## 2020-07-08 LAB — VALPROIC ACID LEVEL: Valproic Acid Lvl: 111 ug/mL — ABNORMAL HIGH (ref 50.0–100.0)

## 2020-07-08 MED ORDER — ACETAMINOPHEN 325 MG PO TABS: 650.0000 mg | ORAL_TABLET | ORAL | Status: DC | PRN

## 2020-07-08 MED ORDER — ONDANSETRON HCL 4 MG PO TABS: 4.0000 mg | ORAL_TABLET | Freq: Three times a day (TID) | ORAL | Status: DC | PRN

## 2020-07-08 MED ORDER — ALUM & MAG HYDROXIDE-SIMETH 200-200-20 MG/5ML PO SUSP: 30.0000 mL | Freq: Four times a day (QID) | ORAL | Status: DC | PRN

## 2020-07-08 NOTE — ED Triage Notes (Signed)
Pt sent from [facility]. Caregiver states that they saw a provider over there who "recommended inpatient" and sent them over here because they "didn't have room for him." Denies pain. Reports that he hears voices that tell him to kill himself and his caregiver.

## 2020-07-08 NOTE — ED Provider Notes (Addendum)
[facility]EMERGENCY DEPT Provider Note   CSN: [id number] Arrival date & time: 07/08/20  2203     History Chief Complaint  Patient presents with   Suicidal    Derrick Nguyen is a 19 y.o. male.  The history is provided by the patient and medical records.   19 y.o. M with hx of ADHD, asthma, depression, ODD, autism, presenting to the ED for medical clearance.  Patient had visit at [facility] yesterday for SI and was discharged home.  He had an E-visit with psychiatry this morning and had medication adjustment but prescription needs to be filled.  Patient has expressed a plan to run away from his facility, buy beer and drive drunk to get into a car accident.  Caregiver was concerned today as they were trying to talk through some of his issues and he seemed to wander off.  Patient has continued to express desire to want to kill himself throughout the day today.  Patient was taken to [facility] tonight and recommended for IP treatment.  He was transferred to [facility] to await bed placement as no appropriate beds at [facility] currently.  Patient has no physical complaints.  He did already take his night time meds so is starting to get very tired.  Past Medical History:  Diagnosis Date   ADHD (attention deficit hyperactivity disorder)    Asthma    Depression    Oppositional defiant disorder     There are no problems to display for this patient.   History reviewed. No pertinent surgical history.     Family History  Family history unknown: Yes    Social History   Tobacco Use   Smoking status: Never Smoker   Smokeless tobacco: Never Used  Substance Use Topics   Alcohol use: Never   Drug use: Never    Home Medications Prior to Admission medications   Medication Sig Start Date End Date Taking? Authorizing Provider  albuterol (VENTOLIN HFA) 108 (90 Base) MCG/ACT inhaler Inhale 2 puffs into the lungs every 6 (six) hours as needed. 05/20/20   [provider]    ARIPiprazole (ABILIFY) 15 MG tablet Take 15 mg by mouth daily. 05/20/20   [provider]  divalproex (DEPAKOTE ER) 500 MG 24 hr tablet Take 500 mg by mouth 2 (two) times daily. 05/20/20   [provider]  escitalopram (LEXAPRO) 20 MG tablet Take 20 mg by mouth daily. 05/20/20   [provider]  hydrOXYzine (VISTARIL) 25 MG capsule Take 25 mg by mouth at bedtime. 05/20/20   [provider]  ketoconazole (NIZORAL) 2 % shampoo Apply 1 application topically once a week. 05/20/20   [provider]  LORazepam (ATIVAN) 1 MG tablet Take 1 mg by mouth 3 (three) times daily. 01/12/20   [provider]  montelukast (SINGULAIR) 10 MG tablet Take 10 mg by mouth daily. 05/20/20   [provider]  OLANZapine (ZYPREXA) 5 MG tablet Take 5 mg by mouth 2 (two) times daily as needed. 05/20/20   [provider]  traZODone (DESYREL) 50 MG tablet Take 50 mg by mouth at bedtime. 05/20/20   [provider]    Allergies    Patient has no known allergies.  Review of Systems   Review of Systems  Psychiatric/Behavioral: Positive for suicidal ideas.  All other systems reviewed and are negative.   Physical Exam Updated Vital Signs BP 140/76 (BP Location: Left Arm)    Pulse 82    Temp 98.8 F (  37.1 C) (Oral)    Resp 16    SpO2 99%   Physical Exam Vitals and nursing note reviewed.  Constitutional:      Appearance: Normal appearance. He is well-developed.  HENT:     Head: Normocephalic and atraumatic.  Eyes:     Conjunctiva/sclera: Conjunctivae normal.     Pupils: Pupils are equal, round, and reactive to light.  Cardiovascular:     Rate and Rhythm: Normal rate and regular rhythm.     Heart sounds: Normal heart sounds.  Pulmonary:     Effort: Pulmonary effort is normal.     Breath sounds: Normal breath sounds. No stridor. No wheezing.  Abdominal:     General: Bowel sounds are normal.     Palpations: Abdomen is soft.     Tenderness:  There is no abdominal tenderness. There is no rebound.  Musculoskeletal:        General: Normal range of motion.     Cervical back: Normal range of motion.  Skin:    General: Skin is warm and dry.  Neurological:     Mental Status: He is alert and oriented to person, place, and time.     ED Results / Procedures / Treatments   Labs (all labs ordered are listed, but only abnormal results are displayed) Labs Reviewed  COMPREHENSIVE METABOLIC PANEL - Abnormal; Notable for the following components:      Result Value   Glucose, Bld 104 (*)    All other components within normal limits  SALICYLATE LEVEL - Abnormal; Notable for the following components:   Salicylate Lvl <7.0 (*)    All other components within normal limits  ACETAMINOPHEN LEVEL - Abnormal; Notable for the following components:   Acetaminophen (Tylenol), Serum <10 (*)    All other components within normal limits  VALPROIC ACID LEVEL - Abnormal; Notable for the following components:   Valproic Acid Lvl 111 (*)    All other components within normal limits  SARS CORONAVIRUS 2 BY RT PCR (HOSPITAL ORDER, PERFORMED IN [facility])  ETHANOL  CBC  RAPID URINE DRUG SCREEN, HOSP PERFORMED    EKG None  Radiology No results found.  Procedures Procedures (including critical care time)  Medications Ordered in ED Medications  alum & mag hydroxide-simeth (MAALOX/MYLANTA) 200-200-20 MG/5ML suspension 30 mL (has no administration in time range)  ondansetron (ZOFRAN) tablet 4 mg (has no administration in time range)  acetaminophen (TYLENOL) tablet 650 mg (has no administration in time range)  traZODone (DESYREL) tablet 50 mg (50 mg Oral Not Given 07/09/20 0313)  OLANZapine (ZYPREXA) tablet 5 mg (has no administration in time range)  OLANZapine (ZYPREXA) tablet 2.5 mg (2.5 mg Oral Not Given 07/09/20 0313)  montelukast (SINGULAIR) tablet 10 mg (0 mg Oral Hold 07/09/20 0314)  ketoconazole (NIZORAL) 2 % shampoo 1  application (has no administration in time range)  hydrOXYzine (ATARAX/VISTARIL) tablet 25 mg (has no administration in time range)  escitalopram (LEXAPRO) tablet 20 mg (has no administration in time range)  divalproex (DEPAKOTE ER) 24 hr tablet 500 mg (has no administration in time range)  ARIPiprazole (ABILIFY) tablet 15 mg (has no administration in time range)  albuterol (VENTOLIN HFA) 108 (90 Base) MCG/ACT inhaler 2 puff (has no administration in time range)    ED Course  I have reviewed the triage vital signs and the nursing notes.  Pertinent labs & imaging results that were available during my care of the patient were reviewed by me and considered in  my medical decision making (see chart for details).    MDM Rules/Calculators/A&P  19 year old male presenting to the ED from behavioral health for medical clearance while awaiting placement.  Seen by behavioral health urgent care yesterday and discharged home.  Had a visit with psychiatry earlier today and had medication adjustment, however had continued to express suicidal ideation so was brought in for formal evaluation.  Had full assessment at behavioral health and was recommended for inpatient placement.  No available beds so sent to the ED pending placement.  Patient without any physical complaints currently other than being tired.  He is already been given his nighttime medications which do make him sleepy.  Vitals are stable.  Screening labs were obtained, overall reassuring.  Depakote level is slightly high, will need to hold dose for today to ensure this trends down.  No signs of depakote toxicity currently. Medically cleared.  Home meds ordered.  Patient has been resting comfortable overnight.  No acute events.  Placement pending.  7:00 AM Care signed out to oncoming provider.  Will hold morning dose of depakote today, plan to re-check level later today.    Final Clinical Impression(s) / ED Diagnoses Final diagnoses:  Suicidal  ideation    Rx / DC Orders ED Discharge Orders    None       Garlon Hatchet, PA-C 07/09/20 0627    Garlon Hatchet, PA-C 07/09/20 0701    Pricilla Loveless, MD 07/17/20 [phone]
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
## 2020-07-08 NOTE — ED Notes (Signed)
LVM with legal guardian [name] [phone]

## 2020-07-09 DIAGNOSIS — F840 Autistic disorder: Secondary | ICD-10-CM

## 2020-07-09 DIAGNOSIS — R45851 Suicidal ideations: Secondary | ICD-10-CM

## 2020-07-09 LAB — SARS CORONAVIRUS 2 BY RT PCR (HOSPITAL ORDER, PERFORMED IN ~~LOC~~ HOSPITAL LAB): SARS Coronavirus 2: NEGATIVE

## 2020-07-09 MED ORDER — DIVALPROEX SODIUM ER 500 MG PO TB24: 500.0000 mg | ORAL_TABLET | ORAL | Status: DC

## 2020-07-09 MED ORDER — ARIPIPRAZOLE 5 MG PO TABS
15.0000 mg | ORAL_TABLET | Freq: Every day | ORAL | Status: DC
  Administered 2020-07-09: 15 mg via ORAL
  Filled 2020-07-09: qty 1

## 2020-07-09 MED ORDER — MONTELUKAST SODIUM 10 MG PO TABS
10.0000 mg | ORAL_TABLET | Freq: Every day | ORAL | Status: DC
  Filled 2020-07-09 (×2): qty 1

## 2020-07-09 MED ORDER — KETOCONAZOLE 2 % EX SHAM
1.0000 "application " | MEDICATED_SHAMPOO | CUTANEOUS | Status: DC
  Filled 2020-07-09: qty 120

## 2020-07-09 MED ORDER — OLANZAPINE 5 MG PO TABS: 5.0000 mg | ORAL_TABLET | Freq: Two times a day (BID) | ORAL | Status: DC | PRN

## 2020-07-09 MED ORDER — ESCITALOPRAM OXALATE 10 MG PO TABS
20.0000 mg | ORAL_TABLET | Freq: Every day | ORAL | Status: DC
  Administered 2020-07-09: 20 mg via ORAL
  Filled 2020-07-09: qty 2

## 2020-07-09 MED ORDER — ALBUTEROL SULFATE HFA 108 (90 BASE) MCG/ACT IN AERS
2.0000 | INHALATION_SPRAY | Freq: Every day | RESPIRATORY_TRACT | Status: DC
  Administered 2020-07-09: 2 via RESPIRATORY_TRACT
  Filled 2020-07-09: qty 6.7

## 2020-07-09 MED ORDER — OLANZAPINE 2.5 MG PO TABS: 2.5000 mg | ORAL_TABLET | Freq: Every day | ORAL | Status: DC

## 2020-07-09 MED ORDER — TRAZODONE HCL 50 MG PO TABS: 50.0000 mg | ORAL_TABLET | Freq: Every day | ORAL | Status: DC

## 2020-07-09 MED ORDER — HYDROXYZINE HCL 25 MG PO TABS: 25.0000 mg | ORAL_TABLET | Freq: Four times a day (QID) | ORAL | Status: DC | PRN

## 2020-07-09 NOTE — BH Assessment (Addendum)
BHH Assessment Progress Note  This voluntary pt was reportedly assessed by Beatriz Stallion, LCAS at [facility] last night.  Findings were staffed with Renaye Rakers, NP, who determined that pt requires hospitalization.  An appropriate bed was not available for the pt, so he was transferred to [facility] to complete disposition.  Today this Clinical research associate staffed pt with Joslyn Devon, RN, who is handling bed control at [facility].  She reports that pt is not appropriate for programming at [facility].  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  His EPIC record does not contain psychometric testing, but does have an "Individual Behavior Support Plan" completed by Denton Ar, MC, HSP-PA.  It indicates that pt has received services through [facility], which typically serves IDD clients.  The following contacts have been made to obtain information:  *At 10:47 I called pt's care giver, [name]. [phone]). He reports that pt's father, Paulino Cork ([phone]), is his legal guardian.  He agrees to fax pt's letter of guardianship to me at [phone].  As of this writing, receipt is pending.  *At 11:07 I called the [facility] and was routed to [employer].  She reports that she served as pt's care coordinator in the past.  She has sent pt's psychometric testing to me and I have received it.  She reports that pt's LME has changed to [facility], and she no longer serves as pt's care coordinator. However, Asa Lente at [employer] may serve in this capacity.  I will be calling him.  I have informed Nelly Rout, MD of these details.  She asks that I reach out to Ophelia Shoulder, FNP to consult on pt to determine dispositional needs at this time, which I have done.  As of this writing, pt's disposition is pending.  Doylene Canning, [place] Behavioral Health Coordinator [phone]   Addendum:  At 12:23 I called Asa Lente with [facility] (cell: [phone]; office: [phone]).   Call rolled to voice mail and I left a message.  As of this writing, return call is pending.  Front desk at [employer] reports that pt's supervisor is Vita Barley [phone]).  As of this writing I have not called her.  Please note that an evaluation by [facility] may be found on pt's paper chart.  I have not yet reviewed it.  Doylene Canning, [place] Behavioral Health Coordinator [phone]  Addendum:  At 12:28 I called [employer] [employer] and spoke to [facility].  She reports that they may have beds available in their IDD unit.  I will defer referral until Marlinda Mike has consulted on pt and decided on his dispositional needs.  Doylene Canning, [place] Behavioral Health Coordinator [phone]

## 2020-07-09 NOTE — Discharge Instructions (Signed)
For your behavioral health needs, you are advised to continue treatment with your current outpatient providers at your earliest opportunity.

## 2020-07-09 NOTE — BH Assessment (Addendum)
BHH Assessment Progress Note  Per Ophelia Shoulder, FNP, this pt does not require psychiatric hospitalization at this time.  Pt is to be discharged from [facility] with recommendation to continue treatment with his current outpatient providers.  This has been included in pt's discharge instructions.  Pt's nurse, Diane, has been notified.  Since this writer's last note, I have received a poor copy of pt's letter of guardianship, identifying the father, Tyrice Hewitt, as guardian.  A call has been placed, rolling to voice mail, and I have left a HIPAA compliant message requesting call back.  Return call is pending as of this writing.  Asa Lente calls back from [facility], leaving a voice message for me.  He reports that he is no longer pt's care coordinator, but that Laney Pastor [phone]) serves in this capacity.  He also provides her supervisor's information in case Shanda Bumps is difficult to reach.  The supervisor is [employer] [phone]).  I was not able to reach [place], but [place] made an unsolicited call to me.  I informed her of pt's disposition, and she agrees to contact pt's care providers to arrange for pt to be picked up.  Diane has been informed of all of these developments.  Doylene Canning, MA Triage Specialist [phone]   Addendum:  At 14:59 pt's father/guardian, Oneal Deputy calls back.  I informed him of pt's disposition.  He gives verbal consent for pt's residential facility to be contacted to pick pt up.  A 15:02 I called [name] [phone]), informing him of disposition.  He agrees to come to [facility] around 17:00 to pick pt up.  Diane has been notified.  Doylene Canning, [place] Behavioral Health Coordinator [phone]

## 2020-07-09 NOTE — BHH Suicide Risk Assessment (Cosign Needed Addendum)
Suicide Risk Assessment  Discharge Assessment   [facility] Discharge Suicide Risk Assessment   Principal Problem: Suicidal ideation Discharge Diagnoses: Principal Problem:   Suicidal ideation Active Problems:   Autism spectrum disorder  July 09, 2020: Derrick Nguyen, 19 y.o., male patient who was previously recommended for inpatient psychiatric admission for suicidal and homicidal ideations verbalized towards his caregiver.  The patient is currently at the [facility] awaiting appropriate placement.    Since he has a history for chronic suicidal ideations in the setting of ODD and IDD, psychiatry felt a secondary assessment would be useful to determine if he continues to met inpatient criteria or if he was suitable for discharge home to follow-up with his already established mental health support team for care continuity.    When I spoke with the patient today and asked how he was doing he responded by saying, "I could be better if I could go home".  When asked about his reason for current admission, he states he was upset and made statements of self harm.  Today he states he does not recall provocative factors and states he no longer wants to hurt himself or others.  He relates the brief change of environment helped his mood. He continues to be medication compliant.  He states he is happy with his current living arrangements and likes the staff that works with him. He reports symptomatic improvement related to rest and change of environment.  He also admits to having chronic suicidal ideations that he says he's had for many years.     Total Time spent with patient: 30 minutes  Musculoskeletal:   Psychiatric Specialty Exam: @ROSBYAGE @  Blood pressure 135/72, pulse 82, temperature 97.9 F (36.6 C), temperature source Oral, resp. rate 16, SpO2 99 %.There is no height or weight on file to calculate BMI.  General Appearance: Casual  Eye Contact::  NA  Speech:  Clear and Coherent and Normal Rate   Volume:  Normal  Mood:  Euthymic  Affect:  Congruent  Thought Process:  Coherent  Orientation:  Full (Time, Place, and Person)  Thought Content:  Logical and Hallucinations: Auditory endorses chronic but improved since admission  Suicidal Thoughts:  endorses chronic passive SI but denies at the time of assessment; also denies plan or intent  Homicidal Thoughts:  No  Memory:  Immediate;   Good Recent;   Fair Remote;   Fair  Judgement:  Fair in the setting of IDD and ADHD  Insight:  Fair in the setting of chronic IDD and ADHD  Psychomotor Activity:  Normal  Concentration:  Good  Recall:  [place] of Knowledge:Good  Language: Good  Akathisia:  NA  Handed:  Right  AIMS (if indicated):     Assets:  Agricultural consultant Housing Social Support  Sleep:     Cognition: Impaired,  Mild in the setting of IDD  ADL's:  Intact   Mental Status Per Nursing Assessment::   On Admission:   Per admission assessment suicidal and homicidal ideations.    Demographic Factors:  NA  Loss Factors: NA  Historical Factors: Impulsivity and ADHD, ODD and IDD  Risk Reduction Factors:   Living with another person, especially a relative and Positive social support  Continued Clinical Symptoms:  Previous Psychiatric Diagnoses and Treatments Medical Diagnoses and Treatments/Surgeries  Cognitive Features That Contribute To Risk:  Closed-mindedness and Thought constriction (tunnel vision)    Suicide Risk:  Mild:  Suicidal ideation of limited frequency, intensity, duration, and specificity.  There are no identifiable plans, no associated intent, mild dysphoria and related symptoms, good self-control (both objective and subjective assessment), few other risk factors, and identifiable protective factors, including available and accessible social support.    Plan Of Care/Follow-up recommendations:  The patient does not meet inpatient criteria at this time and he is  not interested in psychiatric hospitalization.  He reports symptomatic improvement in his mood and is able to contract for safety.  He would like to continue follow-up with his outpatient mental health team.    His depakote levels were elevated so recommend holding this for now, he can follow-up with his outpatient provider within the next 72 hours for continued management.  Attempted to reach his outpatient psychiatric provider, Bernarda Caffey at [phone].  A message was left requesting a return call to this number to further discuss.    No evidence of imminent risk to self or others at present.   Patient does not meet criteria for psychiatric inpatient admission. Supportive therapy provided about ongoing stressors. Discussed crisis plan, support from social network, calling 911, coming to the Emergency Department, and calling Suicide Hotline.  Mallie Darting, NP 07/09/2020, 1:52 PM

## 2020-07-09 NOTE — ED Notes (Signed)
Discharged to [facility]. Pt wore his belongings home. Pt was cooperative. This Clinical research associate delivered pt to [facility] at the exit.

## 2020-08-02 ENCOUNTER — Emergency Department (HOSPITAL_COMMUNITY)
Admission: EM | Admit: 2020-08-02 | Discharge: 2020-08-06 | Disposition: A | Discharge status: Home / Self Care | Payer: Medicaid Other | Attending: Emergency Medicine | Admitting: Emergency Medicine

## 2020-08-02 ENCOUNTER — Encounter (HOSPITAL_COMMUNITY): Payer: Self-pay | Admitting: Emergency Medicine

## 2020-08-02 ENCOUNTER — Other Ambulatory Visit: Payer: Self-pay

## 2020-08-02 DIAGNOSIS — R45851 Suicidal ideations: Secondary | ICD-10-CM | POA: Diagnosis not present

## 2020-08-02 DIAGNOSIS — F329 Major depressive disorder, single episode, unspecified: Secondary | ICD-10-CM | POA: Diagnosis not present

## 2020-08-02 DIAGNOSIS — F989 Unspecified behavioral and emotional disorders with onset usually occurring in childhood and adolescence: Secondary | ICD-10-CM | POA: Insufficient documentation

## 2020-08-02 DIAGNOSIS — Z20822 Contact with and (suspected) exposure to covid-19: Secondary | ICD-10-CM | POA: Diagnosis not present

## 2020-08-02 DIAGNOSIS — F840 Autistic disorder: Secondary | ICD-10-CM | POA: Insufficient documentation

## 2020-08-02 DIAGNOSIS — F909 Attention-deficit hyperactivity disorder, unspecified type: Secondary | ICD-10-CM | POA: Insufficient documentation

## 2020-08-02 DIAGNOSIS — Z79899 Other long term (current) drug therapy: Secondary | ICD-10-CM | POA: Insufficient documentation

## 2020-08-02 DIAGNOSIS — Z046 Encounter for general psychiatric examination, requested by authority: Secondary | ICD-10-CM | POA: Insufficient documentation

## 2020-08-02 DIAGNOSIS — Z01818 Encounter for other preprocedural examination: Secondary | ICD-10-CM | POA: Diagnosis present

## 2020-08-02 DIAGNOSIS — R45850 Homicidal ideations: Secondary | ICD-10-CM | POA: Diagnosis not present

## 2020-08-02 DIAGNOSIS — R4689 Other symptoms and signs involving appearance and behavior: Secondary | ICD-10-CM

## 2020-08-02 DIAGNOSIS — J45909 Unspecified asthma, uncomplicated: Secondary | ICD-10-CM | POA: Diagnosis not present

## 2020-08-02 LAB — RAPID URINE DRUG SCREEN, HOSP PERFORMED
Amphetamines: NOT DETECTED
Barbiturates: NOT DETECTED
Benzodiazepines: NOT DETECTED
Cocaine: NOT DETECTED
Opiates: NOT DETECTED
Tetrahydrocannabinol: NOT DETECTED

## 2020-08-02 LAB — COMPREHENSIVE METABOLIC PANEL
ALT: 15 U/L (ref 0–44)
AST: 17 U/L (ref 15–41)
Albumin: 4.2 g/dL (ref 3.5–5.0)
Alkaline Phosphatase: 38 U/L (ref 38–126)
Anion gap: 11 (ref 5–15)
BUN: 12 mg/dL (ref 6–20)
CO2: 25 mmol/L (ref 22–32)
Calcium: 9.9 mg/dL (ref 8.9–10.3)
Chloride: 106 mmol/L (ref 98–111)
Creatinine, Ser: 0.64 mg/dL (ref 0.61–1.24)
GFR calc Af Amer: >60 mL/min (ref >60)
GFR calc non Af Amer: >60 mL/min (ref >60)
Glucose, Bld: 93 mg/dL (ref 70–99)
Potassium: 4.3 mmol/L (ref 3.5–5.1)
Sodium: 142 mmol/L (ref 135–145)
Total Bilirubin: 1 mg/dL (ref 0.3–1.2)
Total Protein: 6.9 g/dL (ref 6.5–8.1)

## 2020-08-02 LAB — CBC
HCT: 41.3 % (ref 39.0–52.0)
Hemoglobin: 14.1 g/dL (ref 13.0–17.0)
MCH: 31.6 pg (ref 26.0–34.0)
MCHC: 34.1 g/dL (ref 30.0–36.0)
MCV: 92.6 fL (ref 80.0–100.0)
Platelets: 185 10*3/uL (ref 150–400)
RBC: 4.46 MIL/uL (ref 4.22–5.81)
RDW: 12.3 % (ref 11.5–15.5)
WBC: 3.7 10*3/uL — ABNORMAL LOW (ref 4.0–10.5)
nRBC: 0 % (ref 0.0–0.2)

## 2020-08-02 LAB — SALICYLATE LEVEL: Salicylate Lvl: <7 mg/dL — ABNORMAL LOW (ref 7.0–30.0)

## 2020-08-02 LAB — ACETAMINOPHEN LEVEL: Acetaminophen (Tylenol), Serum: <10 ug/mL — ABNORMAL LOW (ref 10–30)

## 2020-08-02 LAB — SARS CORONAVIRUS 2 BY RT PCR (HOSPITAL ORDER, PERFORMED IN ~~LOC~~ HOSPITAL LAB): SARS Coronavirus 2: NEGATIVE

## 2020-08-02 LAB — ETHANOL: Alcohol, Ethyl (B): <10 mg/dL (ref <10)

## 2020-08-02 MED ORDER — ALBUTEROL SULFATE HFA 108 (90 BASE) MCG/ACT IN AERS
2.0000 | INHALATION_SPRAY | Freq: Every day | RESPIRATORY_TRACT | Status: DC
  Administered 2020-08-03 – 2020-08-05 (×3): 2 via RESPIRATORY_TRACT
  Filled 2020-08-02 (×3): qty 6.7

## 2020-08-02 MED ORDER — DIVALPROEX SODIUM ER 500 MG PO TB24: 500.0000 mg | ORAL_TABLET | ORAL | Status: DC

## 2020-08-02 MED ORDER — TRAZODONE HCL 50 MG PO TABS
50.0000 mg | ORAL_TABLET | Freq: Every day | ORAL | Status: DC
  Administered 2020-08-02 – 2020-08-05 (×3): 50 mg via ORAL
  Filled 2020-08-02 (×3): qty 1

## 2020-08-02 MED ORDER — HYDROXYZINE HCL 25 MG PO TABS: 25.0000 mg | ORAL_TABLET | Freq: Four times a day (QID) | ORAL | Status: DC | PRN

## 2020-08-02 MED ORDER — MONTELUKAST SODIUM 10 MG PO TABS
10.0000 mg | ORAL_TABLET | Freq: Every day | ORAL | Status: DC
  Administered 2020-08-02 – 2020-08-05 (×3): 10 mg via ORAL
  Filled 2020-08-02 (×4): qty 1

## 2020-08-02 MED ORDER — OLANZAPINE 5 MG PO TABS
2.5000 mg | ORAL_TABLET | Freq: Every day | ORAL | Status: DC
  Administered 2020-08-02 – 2020-08-05 (×3): 2.5 mg via ORAL
  Filled 2020-08-02 (×5): qty 1

## 2020-08-02 MED ORDER — ARIPIPRAZOLE 5 MG PO TABS
15.0000 mg | ORAL_TABLET | Freq: Every day | ORAL | Status: DC
  Administered 2020-08-03 – 2020-08-06 (×4): 15 mg via ORAL
  Filled 2020-08-02 (×5): qty 1

## 2020-08-02 MED ORDER — ESCITALOPRAM OXALATE 10 MG PO TABS
20.0000 mg | ORAL_TABLET | Freq: Every day | ORAL | Status: DC
  Administered 2020-08-03 – 2020-08-06 (×4): 20 mg via ORAL
  Filled 2020-08-02 (×4): qty 2

## 2020-08-02 MED ORDER — OLANZAPINE 5 MG PO TABS
5.0000 mg | ORAL_TABLET | Freq: Two times a day (BID) | ORAL | Status: DC | PRN
  Filled 2020-08-02: qty 1
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
## 2020-08-02 NOTE — ED Triage Notes (Signed)
Pt brought by [employer] IVC for aggressive behavior.

## 2020-08-02 NOTE — ED Notes (Signed)
Dinner ordered 
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
## 2020-08-03 NOTE — BH Assessment (Signed)
Rae Mar, RN acknowledged and agreed to inform EDP of pt's disposition.  Dolores Frame, MSW, LCSW-A Triage Specialist [phone]
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
## 2020-08-03 NOTE — BH Assessment (Signed)
Disposition: Per Elenore Paddy, NP recommended continuous assessment and will be reassess by psych in the am.    This counselor attempted to contact caregiver [name] at [phone]. This counselor left HIPAA compliance message. Per Elenore Paddy, NP we need to know if pt was to be discharged in the morning, could pt return back home?  This counselor informed Rae Mar, RN of disposition and asked to notify EDP.   Dolores Frame, MSW, LCSW-A Triage Specialist [phone]
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
## 2020-08-03 NOTE — Social Work (Addendum)
No TOC consult was added to chart at time Pt was psych cleared 2:34pm  CSW checked on this Pt due to email from [facility].  CSW called Group Home in an effort to make contact with owner [name] @ [phone]. Voicemail was full.

## 2020-08-03 NOTE — ED Notes (Signed)
Lunch Tray Ordered @ 1048. °

## 2020-08-03 NOTE — ED Notes (Signed)
CW unable to contact group home for safe discharge. IVC order continues. Pt calm and cooperative. Sitter at bedside

## 2020-08-03 NOTE — ED Notes (Signed)
Pt brought into [facility] Room 005 for TTS Assessment. Staff in Area made aware. Sitter at Bedside.
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
## 2020-08-04 NOTE — ED Notes (Signed)
Pt up to desk to contact his grandparents via phone. Pt remains calm and cooperative.

## 2020-08-04 NOTE — Progress Notes (Signed)
4:40 CSW staffed case with [facility] supervisor Macario Golds.   4:30 CSW received call from Dr. Samuella Cota with [facility] requesting Pt's lab results and stating that care team was expecting blood workup to have been performed in ED.  CSW restated lack of any communication before 4 pm, stated that leaving Pt in ED during Covid outbreak after Pt has been cleared is not acceptable.

## 2020-08-04 NOTE — ED Provider Notes (Signed)
Emergency Medicine Observation Re-evaluation Note  Derrick Nguyen is a 19 y.o. male, seen on rounds today.  Pt initially presented to the ED for complaints of Medical Clearance (IVC) Currently, the patient is resting comfortably in the ER bed, talking to his sitter .  Physical Exam  BP 119/71 (BP Location: Left Arm)   Pulse 82   Temp (!) 97.5 F (36.4 C) (Oral)   Resp 18   Ht 6' (1.829 m)   Wt 93 kg   SpO2 99%   BMI 27.81 kg/m  Physical Exam Vitals reviewed.  Constitutional:      Appearance: Normal appearance.  HENT:     Head: Normocephalic and atraumatic.  Eyes:     General:        Right eye: No discharge.        Left eye: No discharge.     Extraocular Movements: Extraocular movements intact.     Conjunctiva/sclera: Conjunctivae normal.  Musculoskeletal:        General: No swelling. Normal range of motion.  Neurological:     General: No focal deficit present.     Mental Status: He is alert and oriented to person, place, and time.  Psychiatric:        Mood and Affect: Mood normal.        Behavior: Behavior normal.     ED Course / MDM  EKG:  I have reviewed the labs performed to date as well as medications administered while in observation.  Recent changes in the last 24 hours include none. Plan  Current plan is for cleared by TTS/psych. Plan is for discharge back to group home. Awaiting ride from caregiver. I personally reviewed his lab work and work up without acute abnormalities. Vitals remain stable. Pt in no acute distress. Stable for discharge.  Patient is not under full IVC at this time.   Mare Ferrari, PA-C 08/04/20 1209    Sabas Sous, MD 08/04/20 1526

## 2020-08-04 NOTE — Progress Notes (Signed)
5:00 with ED secretary Jodi Geralds serving as witness, CSW spoke via phone to Pt's father and legal guardian and received permission to email Pt's lab results to Dr. Samuella Cota of [facility].  CSW securely emailed lab results

## 2020-08-04 NOTE — ED Notes (Signed)
Per CSW Edwin Dada, she is still attempting to get in touch with staff from patient's group home in order to get someone to pick him up for discharge.

## 2020-08-04 NOTE — Progress Notes (Addendum)
4:04 CSW received call from Ronnie Doss of [facility] requesting a meeting with [facility] psychiatric team. CSW informed Ms. Lindley Magnus that she was not on psych team and enquired as to why no one from the care team had requested such a meeting before 4pm.  CSW also informed Ms. Lindley Magnus that group home owner had been contacted at 8 am via voice mail and at 9 am had agreed to come pick up Pt and had thereafter ignored all attempts to communicate via phone until after [employer] had been dispatched to home.  CSW referred Ms. Nickerson to [facility] supervisor Macario Golds as this CSW lacks authority to set [employer] on behalf of [facility] team.

## 2020-08-04 NOTE — Progress Notes (Signed)
CSW spoke with [name] via phone [phone] and asked Mr. Zachery Dauer when he was coming to hospital to retreive Pt. Mr. Zachery Dauer reports that he feels unsafe and cannot come and get Pt.  CSW cemphasized that Pt has been cleared medically and by [facility] team and that Mr. Zachery Dauer is the person paid to care for Pt.    [email or web address] Science [email or web address] from whom [facility] team has received emails regarding care of Pt.  CSW had attempred to call Ms. Muise but voicemail is full.

## 2020-08-04 NOTE — ED Notes (Signed)
Group home will not accept patient

## 2020-08-04 NOTE — Progress Notes (Addendum)
2:30pm: CSW spoke with non-emergency [employer] to have a welfare check completed at this patient address of [address] in [place].  1:30pm: Multiple attempts have been made to contact [name] without success.  9am: CSW received return call from Alecia Lemming who states one of the group home administrators will be on their way shortly to pick up the patient. CSW notified the patient's RN.  8am: CSW attempted to reach [name] at the patient's group home without success - a voicemail was left requesting a return call.  CSW spoke with the patient's father and legal guardian Onalee Hua in attempts to obtain additional contact information for the group home - Onalee Hua states he does not have any contact information. David aware that the patient will be discharged back once contact is made with Alecia Lemming, he is agreeable.  Edwin Dada, MSW, LCSW-A Transitions of Care  Clinical Social Worker  [facility] Emergency Departments  Medical ICU [phone]

## 2020-08-05 NOTE — Care Management Note (Signed)
Case Management Note  Patient Details  Name: Sammuel Blick MRN: [id number] Date of Birth: 13-Aug-2001  Subjective/Objective:     Received call from [facility]  Director of [facility] [phone]) requesting a meeting with Psych MD to discuss plan of care/ medical /psych issues; message left with Caryn Bee FNP to contact Crystal to discuss the patient.     Cherrie Distance, RN, MHA,BSN Transition of Care Supervisor [phone] 08/05/2020, 4:10 PM

## 2020-08-05 NOTE — ED Notes (Signed)
Breakfast Ordered 

## 2020-08-05 NOTE — ED Notes (Signed)
Per Director on patients case, patient will be picked up by Alecia Lemming tomorrow, at 6pm.

## 2020-08-05 NOTE — Social Work (Signed)
TOC CSW received a call from [facility] [phone] or [phone] regarding pt.  CSW is assisting with pts remotely.  Please call her back asap.  Thanks!  Jahmeek Shirk Tarpley-Carter, MSW, LCSW-A                  [name] Transitions of CareClinical Social Worker Manual [email or web address] [phone]

## 2020-08-05 NOTE — ED Notes (Addendum)
IVC papers were not Rescinded yesterday upon dc decision. MD signature on rescind and faxed to magistrate this am. Original placed in red folder, copy on chart.

## 2020-08-05 NOTE — Progress Notes (Signed)
CSW responded to an email that was received from [facility], the patient's [facility] Coordinator regarding her request for further discussion of this patient's psychiatric clearance and medical workup. CSW has not received a response - the patient's lab results were sent to Dr. Samuella Cota of [facility] last night for review.  Edwin Dada, MSW, LCSW-A Transitions of Care  Clinical Social Worker  [facility] Emergency Departments  Medical ICU [phone]

## 2020-08-06 NOTE — ED Notes (Signed)
Breakfast Ordered 

## 2020-08-06 NOTE — ED Notes (Signed)
Fax from outpatient psych pa shown to Dr Freida Busman, no additional lab orders to be placed at this time due to lack of explanation for why labs were ordered.

## 2020-08-06 NOTE — ED Notes (Signed)
Lunch Tray Ordered @ 1109. °

## 2020-08-06 NOTE — Consult Note (Signed)
  This nurse practitioner received a secure chat from Kila, RN; [place], [place], and [place], CSW regarding this patient. Writer was instructed to contact Group Home director [employer], to discuss treatment plan. Upon calling the number in chart I was advised that she had left for the day. The receptionist did allow me to speak with Latanya Maudlin, QMHP. He was advised that patient never was admitted to the hospital he has remained in the ER for 4 days as they have refused to come pick him up. Dorma Russell stated that [name] would be there this afternoon to pick up patient by 6pm. He provides a contact number of [phone]. This was communicated with nursing staff and parties noted above.

## 2020-08-06 NOTE — Progress Notes (Signed)
Message sent to Caryn Bee to contact [employer] Director of [facility] [phone]) for an update on the patient's status. Alexis Goodell Transition of Care Supervisor [phone]

## 2020-08-06 NOTE — Progress Notes (Signed)
CSW working with Steward Drone, [facility] Supervisor to assist with this patient's discharge.  Edwin Dada, MSW, LCSW-A Transitions of Care   Clinical Social Worker  [facility] Emergency Departments   Medical ICU [phone]

## 2020-08-06 NOTE — ED Notes (Signed)
Patient and caregiver Derrick Nguyen) verbalizes understanding of discharge instructions. Opportunity for questioning and answers were provided. Arm band removed by staff, patient discharged from ED to return to group home with Mr. Lawerance Bach. All possessions returned to the pt.

## 2020-09-29 ENCOUNTER — Other Ambulatory Visit: Payer: Self-pay

## 2020-09-29 ENCOUNTER — Ambulatory Visit (HOSPITAL_COMMUNITY)
Admission: EM | Admit: 2020-09-29 | Discharge: 2020-09-30 | Disposition: A | Discharge status: Home / Self Care | Payer: Medicaid Other | Attending: Physician Assistant | Admitting: Physician Assistant

## 2020-09-29 DIAGNOSIS — F909 Attention-deficit hyperactivity disorder, unspecified type: Secondary | ICD-10-CM | POA: Insufficient documentation

## 2020-09-29 DIAGNOSIS — F840 Autistic disorder: Secondary | ICD-10-CM

## 2020-09-29 DIAGNOSIS — Z20822 Contact with and (suspected) exposure to covid-19: Secondary | ICD-10-CM | POA: Insufficient documentation

## 2020-09-29 DIAGNOSIS — F913 Oppositional defiant disorder: Secondary | ICD-10-CM | POA: Insufficient documentation

## 2020-09-29 DIAGNOSIS — Z593 Problems related to living in residential institution: Secondary | ICD-10-CM | POA: Insufficient documentation

## 2020-09-29 DIAGNOSIS — Z79899 Other long term (current) drug therapy: Secondary | ICD-10-CM | POA: Insufficient documentation

## 2020-09-29 DIAGNOSIS — F332 Major depressive disorder, recurrent severe without psychotic features: Secondary | ICD-10-CM | POA: Insufficient documentation

## 2020-09-29 NOTE — ED Notes (Signed)
Patient belongings stored in locker#27

## 2020-09-30 ENCOUNTER — Other Ambulatory Visit: Payer: Self-pay

## 2020-09-30 ENCOUNTER — Encounter (HOSPITAL_COMMUNITY): Payer: Self-pay | Admitting: Emergency Medicine

## 2020-09-30 LAB — COMPREHENSIVE METABOLIC PANEL
ALT: 19 U/L (ref 0–44)
AST: 20 U/L (ref 15–41)
Albumin: 4.3 g/dL (ref 3.5–5.0)
Alkaline Phosphatase: 52 U/L (ref 38–126)
Anion gap: 14 (ref 5–15)
BUN: 19 mg/dL (ref 6–20)
CO2: 25 mmol/L (ref 22–32)
Calcium: 9.9 mg/dL (ref 8.9–10.3)
Chloride: 104 mmol/L (ref 98–111)
Creatinine, Ser: 0.81 mg/dL (ref 0.61–1.24)
GFR calc non Af Amer: >60 mL/min (ref >60)
Glucose, Bld: 75 mg/dL (ref 70–99)
Potassium: 3.9 mmol/L (ref 3.5–5.1)
Sodium: 143 mmol/L (ref 135–145)
Total Bilirubin: 0.7 mg/dL (ref 0.3–1.2)
Total Protein: 7.1 g/dL (ref 6.5–8.1)

## 2020-09-30 LAB — CBC WITH DIFFERENTIAL/PLATELET
Abs Immature Granulocytes: 0.03 10*3/uL (ref 0.00–0.07)
Basophils Absolute: 0.1 10*3/uL (ref 0.0–0.1)
Basophils Relative: 1 %
Eosinophils Absolute: 0.2 10*3/uL (ref 0.0–0.5)
Eosinophils Relative: 3 %
HCT: 42.3 % (ref 39.0–52.0)
Hemoglobin: 14.3 g/dL (ref 13.0–17.0)
Immature Granulocytes: 1 %
Lymphocytes Relative: 35 %
Lymphs Abs: 2.3 10*3/uL (ref 0.7–4.0)
MCH: 31.8 pg (ref 26.0–34.0)
MCHC: 33.8 g/dL (ref 30.0–36.0)
MCV: 94.2 fL (ref 80.0–100.0)
Monocytes Absolute: 0.7 10*3/uL (ref 0.1–1.0)
Monocytes Relative: 11 %
Neutro Abs: 3.3 10*3/uL (ref 1.7–7.7)
Neutrophils Relative %: 49 %
Platelets: 202 10*3/uL (ref 150–400)
RBC: 4.49 MIL/uL (ref 4.22–5.81)
RDW: 12.5 % (ref 11.5–15.5)
WBC: 6.6 10*3/uL (ref 4.0–10.5)
nRBC: 0 % (ref 0.0–0.2)

## 2020-09-30 LAB — POCT URINE DRUG SCREEN - MANUAL ENTRY (I-SCREEN)
POC Amphetamine UR: NOT DETECTED
POC Buprenorphine (BUP): NOT DETECTED
POC Cocaine UR: NOT DETECTED
POC Marijuana UR: NOT DETECTED
POC Methadone UR: NOT DETECTED
POC Methamphetamine UR: NOT DETECTED
POC Morphine: NOT DETECTED
POC Oxazepam (BZO): NOT DETECTED
POC Oxycodone UR: NOT DETECTED
POC Secobarbital (BAR): NOT DETECTED

## 2020-09-30 LAB — RESPIRATORY PANEL BY RT PCR (FLU A&B, COVID)
Influenza A by PCR: NEGATIVE
Influenza B by PCR: NEGATIVE
SARS Coronavirus 2 by RT PCR: NEGATIVE

## 2020-09-30 LAB — POC SARS CORONAVIRUS 2 AG: SARS Coronavirus 2 Ag: NEGATIVE

## 2020-09-30 LAB — VALPROIC ACID LEVEL: Valproic Acid Lvl: 50 ug/mL (ref 50.0–100.0)

## 2020-09-30 LAB — POC SARS CORONAVIRUS 2 AG -  ED: SARS Coronavirus 2 Ag: NEGATIVE

## 2020-09-30 MED ORDER — DIVALPROEX SODIUM 500 MG PO DR TAB
500.0000 mg | DELAYED_RELEASE_TABLET | Freq: Two times a day (BID) | ORAL | Status: DC
  Administered 2020-09-30: 500 mg via ORAL
  Filled 2020-09-30: qty 1

## 2020-09-30 MED ORDER — TRAZODONE HCL 50 MG PO TABS
50.0000 mg | ORAL_TABLET | Freq: Every evening | ORAL | Status: DC | PRN
  Administered 2020-09-30: 50 mg via ORAL
  Filled 2020-09-30: qty 1

## 2020-09-30 MED ORDER — HYDROXYZINE HCL 25 MG PO TABS: 25.0000 mg | ORAL_TABLET | Freq: Three times a day (TID) | ORAL | Status: DC | PRN

## 2020-09-30 MED ORDER — ACETAMINOPHEN 325 MG PO TABS: 650.0000 mg | ORAL_TABLET | Freq: Four times a day (QID) | ORAL | Status: DC | PRN

## 2020-09-30 MED ORDER — ARIPIPRAZOLE 10 MG PO TABS
10.0000 mg | ORAL_TABLET | Freq: Every day | ORAL | Status: DC
  Administered 2020-09-30: 10 mg via ORAL
  Filled 2020-09-30: qty 1

## 2020-09-30 MED ORDER — ALUM & MAG HYDROXIDE-SIMETH 200-200-20 MG/5ML PO SUSP: 30.0000 mL | ORAL | Status: DC | PRN

## 2020-09-30 MED ORDER — ALBUTEROL SULFATE HFA 108 (90 BASE) MCG/ACT IN AERS
2.0000 | INHALATION_SPRAY | Freq: Every day | RESPIRATORY_TRACT | Status: DC
  Filled 2020-09-30: qty 6.7

## 2020-09-30 MED ORDER — GABAPENTIN 100 MG PO CAPS
100.0000 mg | ORAL_CAPSULE | Freq: Every day | ORAL | Status: DC
  Administered 2020-09-30: 100 mg via ORAL
  Filled 2020-09-30: qty 1

## 2020-09-30 MED ORDER — MAGNESIUM HYDROXIDE 400 MG/5ML PO SUSP: 30.0000 mL | Freq: Every day | ORAL | Status: DC | PRN

## 2020-09-30 MED ORDER — OLANZAPINE 10 MG PO TABS
10.0000 mg | ORAL_TABLET | Freq: Every day | ORAL | Status: DC
  Administered 2020-09-30: 10 mg via ORAL
  Filled 2020-09-30: qty 1

## 2020-09-30 MED ORDER — ESCITALOPRAM OXALATE 10 MG PO TABS
20.0000 mg | ORAL_TABLET | Freq: Every day | ORAL | Status: DC
  Administered 2020-09-30: 20 mg via ORAL
  Filled 2020-09-30: qty 2

## 2020-09-30 MED ORDER — MONTELUKAST SODIUM 10 MG PO TABS
10.0000 mg | ORAL_TABLET | Freq: Every day | ORAL | Status: DC
  Administered 2020-09-30: 10 mg via ORAL
  Filled 2020-09-30: qty 1

## 2020-09-30 NOTE — ED Provider Notes (Signed)
FBC/OBS ASAP Discharge Summary  Date and Time: 09/30/2020 10:28 AM  Name: Derrick Nguyen  MRN:  [id number]   Discharge Diagnoses:  Final diagnoses:  Severe episode of recurrent major depressive disorder, without psychotic features (HCC)    Subjective: Patient reports today that he is feeling fine.  He denies any suicidal homicidal ideations and denies any hallucinations.  Patient reports at first that he does not remember what happened last night.  Patient was reminded of small pieces of the events and patient was able to detail becoming get into an argument with his caregiver that he had voiced some suicidal ideations and that he was brought to the hospital by the police.  Patient is asked if he is having some stress due to having to move to a new [facility] and he stated no but he does get tired of being moved around.  He reports that his legal guardian is his father.  Stay Summary: Patient is a 19 year old male who presented to the [facility] by police.  Patient has a known history of depression, ADHD, and oppositional defiant disorder.  Patient had gotten into an argument with his caregiver and he made suicidal comments and threatened to busted his eyeball out with a pen.  Patient was admitted to the continuous observation unit overnight.  Medications were confirmed and restarted.  He was discovered after speaking with the AFL supervisor, Ronnie Dossrystal Nickerson, that the patient is going to a new AFL.  She stated that the patient will be picked up today and either return to his previous AFL caregiver or to the new one.  She stated that she had made an arrangement with the NP last night that the patient could possibly remain here up until 5 PM.  Confirmed this with her and agreed to this as well to provide him time to get placement for the patient.  She was informed that the patient did need to be picked up and discharged today and she stated understanding and agreement.  The patient has continued to deny any  suicidal or homicidal ideations and denied any hallucinations.  Patient does have a history of becoming agitated and making suicidal ideations and being brought to the hospital.  The patient is now since calmed down and does not meet any inpatient psychiatric treatment criteria.  Total Time spent with patient: 20 minutes  Past Psychiatric History: ASD, ODD, ADHD Past Medical History:  Past Medical History:  Diagnosis Date  . ADHD (attention deficit hyperactivity disorder)   . Asthma   . Depression   . Oppositional defiant disorder    History reviewed. No pertinent surgical history. Family History:  Family History  Family history unknown: Yes   Family Psychiatric History: None reported Social History:  Social History   Substance and Sexual Activity  Alcohol Use Never     Social History   Substance and Sexual Activity  Drug Use Never    Social History   Socioeconomic History  . Marital status: Single    Spouse name: Not on file  . Number of children: Not on file  . Years of education: Not on file  . Highest education level: Not on file  Occupational History  . Not on file  Tobacco Use  . Smoking status: Never Smoker  . Smokeless tobacco: Never Used  Substance and Sexual Activity  . Alcohol use: Never  . Drug use: Never  . Sexual activity: Not on file  Other Topics Concern  . Not on file  Social History Narrative  . Not on file   Social Determinants of Health   Financial Resource Strain:   . Difficulty of Paying Living Expenses: Not on file  Food Insecurity:   . Worried About Programme researcher, broadcasting/film/video in the Last Year: Not on file  . Ran Out of Food in the Last Year: Not on file  Transportation Needs:   . Lack of Transportation (Medical): Not on file  . Lack of Transportation (Non-Medical): Not on file  Physical Activity:   . Days of Exercise per Week: Not on file  . Minutes of Exercise per Session: Not on file  Stress:   . Feeling of Stress : Not on file   Social Connections:   . Frequency of Communication with Friends and Family: Not on file  . Frequency of Social Gatherings with Friends and Family: Not on file  . Attends Religious Services: Not on file  . Active Member of Clubs or Organizations: Not on file  . Attends Banker Meetings: Not on file  . Marital Status: Not on file   SDOH:  SDOH Screenings   Alcohol Screen:   . Last Alcohol Screening Score (AUDIT): Not on file  Depression (PHQ2-9): Medium Risk  . PHQ-2 Score: 7  Financial Resource Strain:   . Difficulty of Paying Living Expenses: Not on file  Food Insecurity:   . Worried About Programme researcher, broadcasting/film/video in the Last Year: Not on file  . Ran Out of Food in the Last Year: Not on file  Housing:   . Last Housing Risk Score: Not on file  Physical Activity:   . Days of Exercise per Week: Not on file  . Minutes of Exercise per Session: Not on file  Social Connections:   . Frequency of Communication with Friends and Family: Not on file  . Frequency of Social Gatherings with Friends and Family: Not on file  . Attends Religious Services: Not on file  . Active Member of Clubs or Organizations: Not on file  . Attends Banker Meetings: Not on file  . Marital Status: Not on file  Stress:   . Feeling of Stress : Not on file  Tobacco Use: Low Risk   . Smoking Tobacco Use: Never Smoker  . Smokeless Tobacco Use: Never Used  Transportation Needs:   . Freight forwarder (Medical): Not on file  . Lack of Transportation (Non-Medical): Not on file    Has this patient used any form of tobacco in the last 30 days? (Cigarettes, Smokeless Tobacco, Cigars, and/or Pipes) Prescription not provided because: doesn't smoke  Current Medications:  Current Facility-Administered Medications  Medication Dose Route Frequency Provider Last Rate Last Admin  . acetaminophen (TYLENOL) tablet 650 mg  650 mg Oral Q6H PRN Nwoko, Uchenna E, PA      . albuterol (VENTOLIN HFA) 108  (90 Base) MCG/ACT inhaler 2 puff  2 puff Inhalation QPC breakfast Nwoko, Uchenna E, PA      . alum & mag hydroxide-simeth (MAALOX/MYLANTA) 200-200-20 MG/5ML suspension 30 mL  30 mL Oral Q4H PRN Nwoko, Uchenna E, PA      . ARIPiprazole (ABILIFY) tablet 10 mg  10 mg Oral Daily Nwoko, Uchenna E, PA   10 mg at 09/30/20 0950  . divalproex (DEPAKOTE) DR tablet 500 mg  500 mg Oral BID Nwoko, Uchenna E, PA   500 mg at 09/30/20 0950  . escitalopram (LEXAPRO) tablet 20 mg  20 mg Oral QPC breakfast Nwoko, [place] Sh[place]  E, PA   20 mg at 09/30/20 0949  . gabapentin (NEURONTIN) capsule 100 mg  100 mg Oral QHS Nwoko, Uchenna E, PA   100 mg at 09/30/20 0226  . hydrOXYzine (ATARAX/VISTARIL) tablet 25 mg  25 mg Oral TID PRN Nwoko, Uchenna E, PA      . magnesium hydroxide (MILK OF MAGNESIA) suspension 30 mL  30 mL Oral Daily PRN Nwoko, Uchenna E, PA      . montelukast (SINGULAIR) tablet 10 mg  10 mg Oral QHS Nwoko, Uchenna E, PA   10 mg at 09/30/20 0226  . OLANZapine (ZYPREXA) tablet 10 mg  10 mg Oral QHS Nwoko, Uchenna E, PA   10 mg at 09/30/20 0226  . traZODone (DESYREL) tablet 50 mg  50 mg Oral QHS PRN Nwoko, Uchenna E, PA   50 mg at 09/30/20 0226   Current Outpatient Medications  Medication Sig Dispense Refill  . albuterol (VENTOLIN HFA) 108 (90 Base) MCG/ACT inhaler Inhale 2 puffs into the lungs daily after breakfast.     . ARIPiprazole (ABILIFY) 10 MG tablet Take 10 mg by mouth daily.    . ARIPiprazole (ABILIFY) 15 MG tablet Take 15 mg by mouth daily after breakfast.     . divalproex (DEPAKOTE ER) 500 MG 24 hr tablet Take 50-750 mg by mouth See admin instructions. 500 mg every morning and 750 mg every night    . divalproex (DEPAKOTE) 250 MG DR tablet Take 250 mg by mouth daily.    . divalproex (DEPAKOTE) 500 MG DR tablet Take 500 mg by mouth 2 (two) times daily.    Marland Kitchen escitalopram (LEXAPRO) 20 MG tablet Take 20 mg by mouth daily after breakfast.     . gabapentin (NEURONTIN) 100 MG capsule Take 100 mg by mouth 3  (three) times daily as needed. neauropathy    . hydrOXYzine (VISTARIL) 25 MG capsule Take 25 mg by mouth every 6 (six) hours as needed for anxiety.     Marland Kitchen ketoconazole (NIZORAL) 2 % shampoo Apply 1 application topically every Sunday.     . montelukast (SINGULAIR) 10 MG tablet Take 10 mg by mouth at bedtime.     Marland Kitchen nystatin (MYCOSTATIN/NYSTOP) powder Apply 1 application topically 3 (three) times daily.    Marland Kitchen OLANZapine (ZYPREXA) 10 MG tablet Take 10 mg by mouth at bedtime.    Marland Kitchen OLANZapine (ZYPREXA) 2.5 MG tablet Take 2.5 mg by mouth at bedtime.    Marland Kitchen OLANZapine (ZYPREXA) 5 MG tablet Take 5 mg by mouth 2 (two) times daily as needed (agitation).     . traZODone (DESYREL) 50 MG tablet Take 50 mg by mouth at bedtime.      PTA Medications: (Not in a hospital admission)   Musculoskeletal  Strength & Muscle Tone: within normal limits Gait & Station: normal Patient leans: N/A  Psychiatric Specialty Exam  Presentation  General Appearance: Appropriate for Environment;Casual  Eye Contact:Good  Speech:Clear and Coherent;Normal Rate  Speech Volume:Normal  Handedness:Right   Mood and Affect  Mood:Euthymic  Affect:Congruent;Appropriate   Thought Process  Thought Processes:Coherent  Descriptions of Associations:Intact  Orientation:Full (Time, Place and Person)  Thought Content:WDL  Hallucinations:Hallucinations: None  Ideas of Reference:None  Suicidal Thoughts:Suicidal Thoughts: No  Homicidal Thoughts:Homicidal Thoughts: No   Sensorium  Memory:Immediate Fair;Recent Fair;Remote Fair  Judgment:Fair  Insight:Fair   Executive Functions  Concentration:Fair  Attention Span:Good  Recall:Fair  Fund of Knowledge:Fair  Language:Good   Psychomotor Activity  Psychomotor Activity:Psychomotor Activity: Normal   Assets  Assets:Communication Skills;Desire  for Improvement;Financial Resources/Insurance;Housing;Social Support;Transportation   Sleep  Sleep:Sleep:  Good   Physical Exam  Physical Exam Vitals and nursing note reviewed.  Constitutional:      Appearance: He is well-developed.  HENT:     Head: Normocephalic.  Eyes:     Pupils: Pupils are equal, round, and reactive to light.  Cardiovascular:     Rate and Rhythm: Normal rate.  Pulmonary:     Effort: Pulmonary effort is normal.  Musculoskeletal:        General: Normal range of motion.  Skin:    General: Skin is warm.  Neurological:     Mental Status: He is alert and oriented to person, place, and time.    Review of Systems  Constitutional: Negative.   HENT: Negative.   Eyes: Negative.   Respiratory: Negative.   Cardiovascular: Negative.   Gastrointestinal: Negative.   Genitourinary: Negative.   Musculoskeletal: Negative.   Skin: Negative.   Neurological: Negative.   Endo/Heme/Allergies: Negative.   Psychiatric/Behavioral: Negative.    Blood pressure 123/76, pulse 85, temperature 97.9 F (36.6 C), temperature source Oral, resp. rate 18, height 5' 10.87" (1.8 m), weight 225 lb (102.1 kg), SpO2 100 %. Body mass index is 31.5 kg/m.  Demographic Factors:  Male and Caucasian  Loss Factors: NA  Historical Factors: Impulsivity  Risk Reduction Factors:   Sense of responsibility to family, Living with another person, especially a relative, Positive social support, Positive therapeutic relationship and Positive coping skills or problem solving skills  Continued Clinical Symptoms:  Previous Psychiatric Diagnoses and Treatments  Cognitive Features That Contribute To Risk:  None    Suicide Risk:  Mild:  Suicidal ideation of limited frequency, intensity, duration, and specificity.  There are no identifiable plans, no associated intent, mild dysphoria and related symptoms, good self-control (both objective and subjective assessment), few other risk factors, and identifiable protective factors, including available and accessible social support.  Plan Of Care/Follow-up  recommendations:  Continue activity as tolerated. Continue diet as recommended by your PCP. Ensure to keep all appointments with outpatient providers.  Disposition: Discharge with AFL staff  Gerlene Burdock Dakota Vanwart, FNP 09/30/2020, 10:28 AM

## 2020-09-30 NOTE — Progress Notes (Signed)
Discharge with his care given and personal belongings without incident at 1845 hrs.

## 2020-09-30 NOTE — ED Notes (Signed)
Pt sleeping at present, no distress noted, monitoring for safety. 

## 2020-09-30 NOTE — Progress Notes (Signed)
Received Elic this AM asleep in his chair bed, he woke up and was cooperative with VS assessment. Later he was compliant with his medications. He refused his inhaler. He made a phone call to his grandpa.

## 2020-09-30 NOTE — BH Assessment (Signed)
Comprehensive Clinical Assessment (CCA) Screening, Triage and Referral Note   09/30/2020 Derrick Nguyen [id number]    Derrick Nguyen is a 19 year old male who presents to [facility] via [employer] for disturbance involving caregiver and another person where he stays at. Pt states he became upset and angry, states that he was having some SI thoughts earlier today but not now. Pt denies HI, AVH and SIB. Pt reports good sleeping pattern and good appetite. Per Pt chart he was last assessed on 08/03/20 for similar presentation. Per pt chart pt Pt , receiving services at [facility] for therapy and psychritary. Pt reported, taking medicine as prescribed. Per notes, pt has hx of ADHD, depression, autism spectrum and oppositional defiant disorder.  During assessment Pt was very cooperative toward counselor. Pt affect and mood was restricted with fleeting eye contact. Pt had a slow speech with appropriate thought of content to his mood and circumstances. Pt had poor insight, with paralyzed in decision making.    TTS attempted to contact caregiver Derrick Nguyen at [phone]. This counselor left HIPAA compliance message.      Diagnosis:F84.0 Autism spectrum disorder, Attention-deficit/hyperactivity disorder Disposition: Derrick Knife, PA recommends pt  For overnight observation at [facility]   Visit Diagnosis: Aggressive Behavior  Patient Reported Information How did you hear about [place]? Other (Comment)   Referral name: Derrick Nguyen   Referral phone number: No data recorded Whom do you see for routine medical problems? Primary Care   Practice/Facility Name: [facility]   Practice/Facility Phone Number: No data recorded  Name of Contact: Derrick Riffle, NP   Contact Number: No data recorded  Contact Fax Number: No data recorded  Prescriber Name: No data recorded  Prescriber Address (if known): No data recorded What Is the Reason for Your Visit/Call Today? Depression, suicidal thoughts  How Long  Has This Been Causing You Problems? <Week  Have You Recently Been in Any Inpatient Treatment (Hospital/Detox/Crisis Center/28-Day Program)? No (Pt was unsure)   Name/Location of Program/Hospital:No data recorded  How Long Were You There? No data recorded  When Were You Discharged? No data recorded Have You Ever Received Services From [facility] Before? Yes   Who Do You See at [facility]? [facility]  Have You Recently Had Any Thoughts About Hurting Yourself? Yes (Pt reports only saying that when he is mad)   Are You Planning to Commit Suicide/Harm Yourself At This time?  No  Have you Recently Had Thoughts About Hurting Someone Derrick Nguyen? Yes   Explanation: No data recorded Have You Used Any Alcohol or Drugs in the Past 24 Hours? No   How Long Ago Did You Use Drugs or Alcohol?  No data recorded  What Did You Use and How Much? No data recorded What Do You Feel Would Help You the Most Today? Assessment Only  Do You Currently Have a Therapist/Psychiatrist? Yes   Name of Therapist/Psychiatrist: Dr Derrick Nguyen (Derrick Nguyen)   Have You Been Recently Discharged From Any Office Practice or Programs? No   Explanation of Discharge From Practice/Program:  No data recorded    CCA Screening Triage Referral Assessment Type of Contact: Face-to-Face   Is this Initial or Reassessment? Initial Assessment   Date Telepsych consult ordered in [facility]:  10/7//21  Time Telepsych consult ordered in [facility]:   Patient Reported Information Reviewed? Yes   Patient Left Without Being Seen? No data recorded  Reason for Not Completing Assessment: No data recorded Collateral Involvement: Derrick Nguyen,[id number]  Does Patient Have a  Court Appointed Legal Guardian? No data recorded  Name and Contact of Legal Guardian:  No data recorded If Minor and Not Living with Parent(s), Who has Custody? No data recorded Is CPS involved or ever been involved? Never  Is APS involved or ever been involved? Never  Patient Determined To  Be At Risk for Harm To Self or Others Based on Review of Patient Reported Information or Presenting Complaint? No   Method:   Availability of Means: No access or NA   Intent: Vague intent or NA   Notification Required: Identifiable person is aware   Additional Information for Danger to Others Potential:  Previous attempts;Active psychosis   Additional Comments for Danger to Others Potential:  Voices telling him to harm himself and his caregiver.   Are There Guns or Other Weapons in Your Home?  No    Types of Guns/Weapons: No data recorded   Are These Weapons Safely Secured?                              No data recorded   Who Could Verify You Are Able To Have These Secured:    No data recorded Do You Have any Outstanding Charges, Pending Court Dates, Parole/Probation? NO  Contacted To Inform of Risk of Harm To Self or Others: Other: Comment (AFL)  Location of Assessment: [facility] Assessment Services  Does Patient Present under Involuntary Commitment? No   IVC Papers Initial File Date:   [place] of Residence: [place]  Patient Currently Receiving the Following Services: Medication Management;Individual Therapy   Determination of Need: Routine (7 days)   Options For Referral: Other: Comment   Derrick Nguyen, Derrick Nguyen

## 2020-09-30 NOTE — ED Notes (Signed)
Patient conversing with family. Patient reported being nervous about moving to another facility. Patient cooperative

## 2020-09-30 NOTE — ED Notes (Signed)
Patient discharged.

## 2020-09-30 NOTE — ED Notes (Signed)
Pt A&O x 4, presents with suicidal thoughts after an argument with caregiver at group home earlier tonight.  Denies SI, HI or AVH at present.  Pt is Autistic.  Calm & cooperative at present, no distress noted, Skin search completed, monitoring for safety.  Comfort measures given.

## 2020-09-30 NOTE — Discharge Instructions (Addendum)

## 2020-09-30 NOTE — ED Provider Notes (Signed)
Behavioral Health Admission [facility] & OBS)  Date: 09/30/20 Patient Name: Derrick Nguyen MRN: [id number] Chief Complaint:  Chief Complaint  Patient presents with   Suicidal      Diagnoses:  Final diagnoses:  Severe episode of recurrent major depressive disorder, without psychotic features (HCC)    HPI:  Derrick Nguyen is a 19 year old, male with a PmHx/PPsychHx significant for depression, ADHD, and oppositional defiant disorder who presents to [facility] by [facility]. When asked why his came in today, patient states that he got upset and did something he wasn't supposed to. Patient states that he gave out his caregiver's personal information to his mom. At first, patient couldn't remember what made him angry, but later stated that he got mad because his caregiver told him to take a shower and take his medications. Patient states that he got angry to which the caregiver responded by calling the [employer]. Patient has been with this caregiver for 6 months. Patient states that he likes his caregiver and that he wanted to start over with his caregiver; "on a clean slate."  Patient currently denies homicide ideation, but stated before he was delivered to [facility], he had thoughts of harming himself with a plan to "bust eyeball out with a pen." Patient denies homicide ideation. Patient further denies auditory or visual hallucinations. Patient states that he is ok to go home. Patient reports that he is eating good. He also states that he is sleeping well, but on occasion will experience sleep from eating a lot of sweets. Patient is unsure of the medications he is taking and the dosages at which he is taking them. Patient endorses having both a therapist and a psychiatrist.  Patient's caregiver was contact to corroborate the patient's story. [name] is the patient's caregiver at [facility], an AFL facility. Caregiver states that the patient is transitioning to another AFL facility soon and his  notice for being his caregiver is expiring soon. Caregiver reports that he has been having some issue with the patient's behavior in light of these new developments. He reports the patient said he was suicidal, which prompted the caregiver to call [employer] to transport patient to [facility]. Caregiver states that he feels that the patient was serious about having thoughts of harming self. The caregiver states that he has concerns over the patient's wellbeing if he were to be discharged back into his care. Caregiver was able to provide a list of the patient's medications. I was also able to get in touch with the supervisor of [facility], [employer]. She said that she and the caregiver were in the process of setting the patient up in a new AFL facility and that the process should hopefully be completed around 5:00pm 09/30/2020. The supervisor provided her number in case the staff at [facility] needed to reach her.   (Crystal Nickerson/Supervisor of [phone])  PHQ 2-9:    ED from 06/13/2020 in [facility]  Thoughts that you would be better off dead, or of hurting yourself in some way More than half the days  PHQ-9 Total Score 7        ED from 08/02/2020 in [facility] EMERGENCY DEPARTMENT ED from 07/08/2020 in [facility]  C-SSRS RISK CATEGORY Error: Q7 should not be populated when Q6 is No High Risk       Total Time spent with patient: 30 minutes  Musculoskeletal  Strength & Muscle Tone: within normal  limits Gait & Station: normal Patient leans: N/A  Psychiatric Specialty Exam  Presentation General Appearance: Appropriate for Environment;Well Groomed  Eye Contact:Good  Speech:Clear and Coherent;Normal Rate  Speech Volume:Normal  Handedness:Right   Mood and Affect  Mood:Anxious  Affect:Appropriate   Thought Process  Thought Processes:Coherent;Goal Directed  Descriptions  of Associations:Intact  Orientation:Full (Time, Place and Person)  Thought Content:Logical  Hallucinations:Hallucinations: None  Ideas of Reference:None  Suicidal Thoughts:Suicidal Thoughts: No  Homicidal Thoughts:Homicidal Thoughts: No   Sensorium  Memory:Immediate Fair;Recent Fair  Judgment:Fair  Insight:Fair   Executive Functions  Concentration:Fair  Attention Span:Fair  Recall:Fair  Fund of Knowledge:Fair  Language:Fair   Psychomotor Activity  Psychomotor Activity:Psychomotor Activity: Normal   Assets  Assets:Communication Skills;Desire for Improvement;Financial Resources/Insurance;Social Support   Sleep  Sleep:Sleep: Good   Physical Exam Constitutional:      Appearance: Normal appearance.  HENT:     Head: Normocephalic and atraumatic.     Nose: Nose normal.  Eyes:     Extraocular Movements: Extraocular movements intact.     Pupils: Pupils are equal, round, and reactive to light.  Cardiovascular:     Pulses: Normal pulses.     Heart sounds: Normal heart sounds.  Pulmonary:     Effort: Pulmonary effort is normal.     Breath sounds: Normal breath sounds.  Musculoskeletal:        General: Normal range of motion.     Cervical back: Normal range of motion and neck supple.  Skin:    General: Skin is warm and dry.  Neurological:     Mental Status: He is alert and oriented to person, place, and time.  Psychiatric:        Mood and Affect: Mood normal.        Behavior: Behavior normal.        Thought Content: Thought content normal.    Review of Systems  Constitutional: Negative.   HENT: Negative.   Eyes: Negative.   Respiratory: Negative.   Cardiovascular: Negative.   Gastrointestinal: Negative.   Musculoskeletal: Negative.   Skin: Negative.   Neurological: Negative.   Psychiatric/Behavioral: Negative for depression, hallucinations and suicidal ideas. The patient is nervous/anxious.     Blood pressure (!) 154/80, pulse 70, temperature  97.8 F (36.6 C), temperature source Tympanic, resp. rate 18, height 5' 10.87" (1.8 m), weight 225 lb (102.1 kg), SpO2 98 %. Body mass index is 31.5 kg/m.  Past Psychiatric History: Attention Deficit Hyperactivity Disorder Depression Oppositional Defiant Disorder   Is the patient at risk to self? No  Has the patient been a risk to self in the past 6 months? Yes .    Has the patient been a risk to self within the distant past? Yes   Is the patient a risk to others? No   Has the patient been a risk to others in the past 6 months? Yes   Has the patient been a risk to others within the distant past? Yes   Past Medical History:  Past Medical History:  Diagnosis Date   ADHD (attention deficit hyperactivity disorder)    Asthma    Depression    Oppositional defiant disorder    History reviewed. No pertinent surgical history.  Family History:  Family History  Family history unknown: Yes    Social History:  Social History   Socioeconomic History   Marital status: Single    Spouse name: Not on file   Number of children: Not on file   Years of education:  Not on file   Highest education level: Not on file  Occupational History   Not on file  Tobacco Use   Smoking status: Never Smoker   Smokeless tobacco: Never Used  Substance and Sexual Activity   Alcohol use: Never   Drug use: Never   Sexual activity: Not on file  Other Topics Concern   Not on file  Social History Narrative   Not on file   Social Determinants of Health   Financial Resource Strain:    Difficulty of Paying Living Expenses: Not on file  Food Insecurity:    Worried About Running Out of Food in the Last Year: Not on file   Ran Out of Food in the Last Year: Not on file  Transportation Needs:    Lack of Transportation (Medical): Not on file   Lack of Transportation (Non-Medical): Not on file  Physical Activity:    Days of Exercise per Week: Not on file   Minutes of Exercise per  Session: Not on file  Stress:    Feeling of Stress : Not on file  Social Connections:    Frequency of Communication with Friends and Family: Not on file   Frequency of Social Gatherings with Friends and Family: Not on file   Attends Religious Services: Not on file   Active Member of Clubs or Organizations: Not on file   Attends Banker Meetings: Not on file   Marital Status: Not on file  Intimate Partner Violence:    Fear of Current or Ex-Partner: Not on file   Emotionally Abused: Not on file   Physically Abused: Not on file   Sexually Abused: Not on file    SDOH:  SDOH Screenings   Alcohol Screen:    Last Alcohol Screening Score (AUDIT): Not on file  Depression (PHQ2-9): Medium Risk   PHQ-2 Score: 7  Financial Resource Strain:    Difficulty of Paying Living Expenses: Not on file  Food Insecurity:    Worried About Programme researcher, broadcasting/film/video in the Last Year: Not on file   [employer] of Food in the Last Year: Not on file  Housing:    Last Housing Risk Score: Not on file  Physical Activity:    Days of Exercise per Week: Not on file   Minutes of Exercise per Session: Not on file  Social Connections:    Frequency of Communication with Friends and Family: Not on file   Frequency of Social Gatherings with Friends and Family: Not on file   Attends Religious Services: Not on file   Active Member of Clubs or Organizations: Not on file   Attends Banker Meetings: Not on file   Marital Status: Not on file  Stress:    Feeling of Stress : Not on file  Tobacco Use: Low Risk    Smoking Tobacco Use: Never Smoker   Smokeless Tobacco Use: Never Used  Transportation Needs:    Freight forwarder (Medical): Not on file   Lack of Transportation (Non-Medical): Not on file    Last Labs:  Admission on 09/29/2020  Component Date Value Ref Range Status   POC Amphetamine UR 09/30/2020 None Detected  None Detected Preliminary   POC  Secobarbital (BAR) 09/30/2020 None Detected  None Detected Preliminary   POC Buprenorphine (BUP) 09/30/2020 None Detected  None Detected Preliminary   POC Oxazepam (BZO) 09/30/2020 None Detected  None Detected Preliminary   POC Cocaine UR 09/30/2020 None Detected  None Detected Preliminary  POC Methamphetamine UR 09/30/2020 None Detected  None Detected Preliminary   POC Morphine 09/30/2020 None Detected  None Detected Preliminary   POC Oxycodone UR 09/30/2020 None Detected  None Detected Preliminary   POC Methadone UR 09/30/2020 None Detected  None Detected Preliminary   POC Marijuana UR 09/30/2020 None Detected  None Detected Preliminary   SARS Coronavirus 2 Ag 09/30/2020 Negative  Negative Preliminary   SARS Coronavirus 2 Ag 09/30/2020 NEGATIVE  NEGATIVE Final   Comment: (NOTE) SARS-CoV-2 antigen NOT DETECTED.   Negative results are presumptive.  Negative results do not preclude SARS-CoV-2 infection and should not be used as the sole basis for treatment or other patient management decisions, including infection  control decisions, particularly in the presence of clinical signs and  symptoms consistent with COVID-19, or in those who have been in contact with the virus.  Negative results must be combined with clinical observations, patient history, and epidemiological information. The expected result is Negative.  Fact Sheet for Patients: https://sanders-williams.net/  Fact Sheet for Healthcare Providers: https://martinez.com/   This test is not yet approved or cleared by the [place] FDA and  has been authorized for detection and/or diagnosis of SARS-CoV-2 by FDA under an Emergency Use Authorization (EUA).  This EUA will remain in effect (meaning this test can be used) for the duration of  the C                          OVID-19 declaration under Section 564(b)(1) of the Act, 21 U.S.C. section 360bbb-3(b)(1), unless the authorization  is terminated or revoked sooner.    Admission on 08/02/2020, Discharged on 08/06/2020  Component Date Value Ref Range Status   Sodium 08/02/2020 142  135 - 145 mmol/L Final   Potassium 08/02/2020 4.3  3.5 - 5.1 mmol/L Final   Chloride 08/02/2020 106  98 - 111 mmol/L Final   CO2 08/02/2020 25  22 - 32 mmol/L Final   Glucose, Bld 08/02/2020 93  70 - 99 mg/dL Final   Glucose reference range applies only to samples taken after fasting for at least 8 hours.   BUN 08/02/2020 12  6 - 20 mg/dL Final   Creatinine, Ser 08/02/2020 0.64  0.61 - 1.24 mg/dL Final   Calcium 95/28/4132 9.9  8.9 - 10.3 mg/dL Final   Total Protein 44/12/270 6.9  6.5 - 8.1 g/dL Final   Albumin 53/66/4403 4.2  3.5 - 5.0 g/dL Final   AST 47/42/5956 17  15 - 41 U/L Final   ALT 08/02/2020 15  0 - 44 U/L Final   Alkaline Phosphatase 08/02/2020 38  38 - 126 U/L Final   Total Bilirubin 08/02/2020 1.0  0.3 - 1.2 mg/dL Final   GFR calc non Af Amer 08/02/2020 >60  >60 mL/min Final   GFR calc Af Amer 08/02/2020 >60  >60 mL/min Final   Anion gap 08/02/2020 11  5 - 15 Final   Performed at [facility], [address], [place], [place] [address]   Alcohol, Ethyl (B) 08/02/2020 <10  <10 mg/dL Final   Comment: (NOTE) Lowest detectable limit for serum alcohol is 10 mg/dL.  For medical purposes only. Performed at [facility], [address], [place], [place] [address]    Salicylate Lvl 08/02/2020 <7.0* 7.0 - 30.0 mg/dL Final   Performed at [facility], [address] [place], [place] [address]   Acetaminophen (Tylenol), Serum 08/02/2020 <10* 10 - 30  ug/mL Final   Comment: (NOTE) Therapeutic concentrations vary significantly. A range of 10-30 ug/mL  may be an effective concentration for many patients. However, some  are best treated at concentrations outside of this range. Acetaminophen concentrations >150 ug/mL at 4 hours after ingestion  and >50 ug/mL at 12 hours after ingestion are  often associated with  toxic reactions.  Performed at [facility], [address] [place], [place] [address]    WBC 08/02/2020 3.7* 4.0 - 10.5 K/uL Final   RBC 08/02/2020 4.46  4.22 - 5.81 MIL/uL Final   Hemoglobin 08/02/2020 14.1  13.0 - 17.0 g/dL Final   HCT 60/45/409808/08/2020 41.3  39 - 52 % Final   MCV 08/02/2020 92.6  80.0 - 100.0 fL Final   MCH 08/02/2020 31.6  26.0 - 34.0 pg Final   MCHC 08/02/2020 34.1  30.0 - 36.0 g/dL Final   RDW 11/91/478208/08/2020 12.3  11.5 - 15.5 % Final   Platelets 08/02/2020 185  150 - 400 K/uL Final   nRBC 08/02/2020 0.0  0.0 - 0.2 % Final   Performed at [facility], [address] [place], [place] [address]   Opiates 08/02/2020 NONE DETECTED  NONE DETECTED Final   Cocaine 08/02/2020 NONE DETECTED  NONE DETECTED Final   Benzodiazepines 08/02/2020 NONE DETECTED  NONE DETECTED Final   Amphetamines 08/02/2020 NONE DETECTED  NONE DETECTED Final   Tetrahydrocannabinol 08/02/2020 NONE DETECTED  NONE DETECTED Final   Barbiturates 08/02/2020 NONE DETECTED  NONE DETECTED Final   Comment: (NOTE) DRUG SCREEN FOR MEDICAL PURPOSES ONLY.  IF CONFIRMATION IS NEEDED FOR ANY PURPOSE, NOTIFY LAB WITHIN 5 DAYS.  LOWEST DETECTABLE LIMITS FOR URINE DRUG SCREEN Drug Class                     Cutoff (ng/mL) Amphetamine and metabolites    1000 Barbiturate and metabolites    200 Benzodiazepine                 200 Tricyclics and metabolites     300 Opiates and metabolites        300 Cocaine and metabolites        300 THC                            50 Performed at [facility], [address] [place], [place] [address]    SARS Coronavirus 2 08/02/2020 NEGATIVE  NEGATIVE Final   Comment: (NOTE) SARS-CoV-2 target nucleic acids are NOT DETECTED.  The SARS-CoV-2 RNA is generally detectable in upper and lower respiratory specimens during the acute phase of infection. The lowest concentration of SARS-CoV-2 viral copies this assay can detect is  250 copies / mL. A negative result does not preclude SARS-CoV-2 infection and should not be used as the sole basis for treatment or other patient management decisions.  A negative result may occur with improper specimen collection / handling, submission of specimen other than nasopharyngeal swab, presence of viral mutation(s) within the areas targeted by this assay, and inadequate number of viral copies (<250 copies / mL). A negative result must be combined with clinical observations, patient history, and epidemiological information.  Fact Sheet for Patients:   BoilerBrush.com.cyhttps://www.fda.gov/media/136312/download  Fact Sheet for Healthcare Providers: https://pope.com/https://www.fda.gov/media/136313/download  This test is not yet approved or  cleared by the [place] and has been authorized for detection and/or diagnosis of SARS-CoV-2 by FDA under an Emergency Use Authorization (EUA).  This EUA will remain in effect (meaning this test can be used) for the duration of the COVID-19 declaration under Section 564(b)(1) of the Act, 21 U.S.C. section 360bbb-3(b)(1), unless the authorization is terminated or revoked sooner.  Performed at [facility], [address], [place], [place] [address]   Admission on 07/08/2020, Discharged on 07/09/2020  Component Date Value Ref Range Status   Sodium 07/08/2020 142  135 - 145 mmol/L Final   Potassium 07/08/2020 4.2  3.5 - 5.1 mmol/L Final   Chloride 07/08/2020 103  98 - 111 mmol/L Final   CO2 07/08/2020 27  22 - 32 mmol/L Final   Glucose, Bld 07/08/2020 104* 70 - 99 mg/dL Final   Glucose reference range applies only to samples taken after fasting for at least 8 hours.   BUN 07/08/2020 15  6 - 20 mg/dL Final   Creatinine, Ser 07/08/2020 0.63  0.61 - 1.24 mg/dL Final   Calcium 60/45/4098 9.6  8.9 - 10.3 mg/dL Final   Total Protein 11/91/4782 7.8  6.5 - 8.1 g/dL Final   Albumin 95/62/1308 4.5  3.5 - 5.0 g/dL Final   AST  65/78/4696 18  15 - 41 U/L Final   ALT 07/08/2020 12  0 - 44 U/L Final   Alkaline Phosphatase 07/08/2020 49  38 - 126 U/L Final   Total Bilirubin 07/08/2020 0.5  0.3 - 1.2 mg/dL Final   GFR calc non Af Amer 07/08/2020 >60  >60 mL/min Final   GFR calc Af Amer 07/08/2020 >60  >60 mL/min Final   Anion gap 07/08/2020 12  5 - 15 Final   Performed at [facility], [address] [place], [place] [address]   Alcohol, Ethyl (B) 07/08/2020 <10  <10 mg/dL Final   Comment: (NOTE) Lowest detectable limit for serum alcohol is 10 mg/dL.  For medical purposes only. Performed at [facility], [address] [place], [place] [address]    Salicylate Lvl 07/08/2020 <7.0* 7.0 - 30.0 mg/dL Final   Performed at [facility], [address], [place], [place] [address]   Acetaminophen (Tylenol), Serum 07/08/2020 <10* 10 - 30 ug/mL Final   Comment: (NOTE) Therapeutic concentrations vary significantly. A range of 10-30 ug/mL  may be an effective concentration for many patients. However, some  are best treated at concentrations outside of this range. Acetaminophen concentrations >150 ug/mL at 4 hours after ingestion  and >50 ug/mL at 12 hours after ingestion are often associated with  toxic reactions.  Performed at [facility], [address], [place], [place] [address]    WBC 07/08/2020 6.2  4.0 - 10.5 K/uL Final   RBC 07/08/2020 4.39  4.22 - 5.81 MIL/uL Final   Hemoglobin 07/08/2020 14.2  13.0 - 17.0 g/dL Final   HCT 64/40/3474 40.9  39 - 52 % Final   MCV 07/08/2020 93.2  80.0 - 100.0 fL Final   MCH 07/08/2020 32.3  26.0 - 34.0 pg Final   MCHC 07/08/2020 34.7  30.0 - 36.0 g/dL Final   RDW 25/95/6387 12.1  11.5 - 15.5 % Final   Platelets 07/08/2020 173  150 - 400 K/uL Final   nRBC 07/08/2020 0.0  0.0 - 0.2 % Final   Performed at [facility], [address], [place], [place] [address]  SARS Coronavirus 2 07/08/2020 NEGATIVE  NEGATIVE Final   Comment: (NOTE) SARS-CoV-2 target nucleic acids are NOT DETECTED.  The SARS-CoV-2 RNA is generally detectable in upper and lower respiratory specimens during the acute phase of infection. The lowest concentration of SARS-CoV-2 viral copies this assay can detect is 250 copies / mL. A negative result does not preclude SARS-CoV-2 infection and should not be used as the sole basis for treatment or other patient management decisions.  A negative result may occur with improper specimen collection / handling, submission of specimen other than nasopharyngeal swab, presence of viral mutation(s) within the areas targeted by this assay, and inadequate number of viral copies (<250 copies / mL). A negative result must be combined with clinical observations, patient history, and epidemiological information.  Fact Sheet for Patients:   BoilerBrush.com.cy  Fact Sheet for Healthcare Providers: https://pope.com/  This test is not yet approved or                           cleared by the [place] FDA and has been authorized for detection and/or diagnosis of SARS-CoV-2 by FDA under an Emergency Use Authorization (EUA).  This EUA will remain in effect (meaning this test can be used) for the duration of the COVID-19 declaration under Section 564(b)(1) of the Act, 21 U.S.C. section 360bbb-3(b)(1), unless the authorization is terminated or revoked sooner.  Performed at [facility], [address], [place], [place] [address]    Valproic Acid Lvl 07/08/2020 111* 50.0 - 100.0 ug/mL Final   Performed at [facility], [address] [place], [place] [address]  Admission on 06/13/2020, Discharged on 06/14/2020  Component Date Value Ref Range Status   Sodium 06/13/2020 140  135 - 145 mmol/L Final   Potassium 06/13/2020 3.7  3.5 - 5.1 mmol/L Final    Chloride 06/13/2020 102  98 - 111 mmol/L Final   CO2 06/13/2020 26  22 - 32 mmol/L Final   Glucose, Bld 06/13/2020 87  70 - 99 mg/dL Final   Glucose reference range applies only to samples taken after fasting for at least 8 hours.   BUN 06/13/2020 13  6 - 20 mg/dL Final   Creatinine, Ser 06/13/2020 0.67  0.61 - 1.24 mg/dL Final   Calcium 29/56/2130 9.6  8.9 - 10.3 mg/dL Final   Total Protein 86/57/8469 7.0  6.5 - 8.1 g/dL Final   Albumin 62/95/2841 4.4  3.5 - 5.0 g/dL Final   AST 32/44/0102 15  15 - 41 U/L Final   ALT 06/13/2020 13  0 - 44 U/L Final   Alkaline Phosphatase 06/13/2020 49  38 - 126 U/L Final   Total Bilirubin 06/13/2020 0.9  0.3 - 1.2 mg/dL Final   GFR calc non Af Amer 06/13/2020 >60  >60 mL/min Final   GFR calc Af Amer 06/13/2020 >60  >60 mL/min Final   Anion gap 06/13/2020 12  5 - 15 Final   Performed at [facility], [address] [place], [place] [address]   POC Amphetamine UR 06/13/2020 None Detected  None Detected Preliminary   POC Secobarbital (BAR) 06/13/2020 None Detected  None Detected Preliminary   POC Buprenorphine (BUP) 06/13/2020 None Detected  None Detected Preliminary   POC Oxazepam (BZO) 06/13/2020 None Detected  None Detected Preliminary   POC Cocaine UR 06/13/2020 None Detected  None Detected Preliminary   POC Methamphetamine UR 06/13/2020 None Detected  None Detected Preliminary  POC Morphine 06/13/2020 None Detected  None Detected Preliminary   POC Oxycodone UR 06/13/2020 None Detected  None Detected Preliminary   POC Methadone UR 06/13/2020 None Detected  None Detected Preliminary   POC Marijuana UR 06/13/2020 None Detected  None Detected Preliminary   WBC 06/13/2020 8.1  4.0 - 10.5 K/uL Final   RBC 06/13/2020 4.63  4.22 - 5.81 MIL/uL Final   Hemoglobin 06/13/2020 14.5  13.0 - 17.0 g/dL Final   HCT 91/47/8295 42.8  39 - 52 % Final   MCV 06/13/2020 92.4  80.0 - 100.0 fL Final   MCH 06/13/2020 31.3  26.0 - 34.0  pg Final   MCHC 06/13/2020 33.9  30.0 - 36.0 g/dL Final   RDW 62/13/0865 12.1  11.5 - 15.5 % Final   Platelets 06/13/2020 186  150 - 400 K/uL Final   nRBC 06/13/2020 0.0  0.0 - 0.2 % Final   Neutrophils Relative % 06/13/2020 71  % Final   Neutro Abs 06/13/2020 5.8  1.7 - 7.7 K/uL Final   Lymphocytes Relative 06/13/2020 20  % Final   Lymphs Abs 06/13/2020 1.6  0.7 - 4.0 K/uL Final   Monocytes Relative 06/13/2020 8  % Final   Monocytes Absolute 06/13/2020 0.6  0 - 1 K/uL Final   Eosinophils Relative 06/13/2020 0  % Final   Eosinophils Absolute 06/13/2020 0.0  0 - 0 K/uL Final   Basophils Relative 06/13/2020 1  % Final   Basophils Absolute 06/13/2020 0.1  0 - 0 K/uL Final   Immature Granulocytes 06/13/2020 0  % Final   Abs Immature Granulocytes 06/13/2020 0.02  0.00 - 0.07 K/uL Final   Performed at [facility] [facility], [address], [place], [place] [address]   SARS Coronavirus 2 Ag 06/13/2020 NEGATIVE  NEGATIVE Final   Performed at [facility], [address], [place], [place] [address]    Allergies: Patient has no known allergies.  PTA Medications: (Not in a hospital admission)   Medical Decision Making  Based on my evaluation and after talking with the patient's caregiver, patient meets criteria for admission to [facility] for continuous observation. Admission labs have been ordered and initiated. Patient's home medication have been reviewed and ordered.    Recommendations  Based on my evaluation the patient does not appear to have an emergency medical condition.  Meta Hatchet, PA 09/30/20  4:42 AM

## 2020-09-30 NOTE — ED Triage Notes (Signed)
Presents with suicidal thoughts after disturbance involving caregiver at Group Home.  Denies SI at present.

## 2020-10-31 ENCOUNTER — Emergency Department (HOSPITAL_COMMUNITY)
Admission: EM | Admit: 2020-10-31 | Discharge: 2020-11-03 | Disposition: A | Discharge status: Home / Self Care | Payer: Medicaid Other | Attending: Emergency Medicine | Admitting: Emergency Medicine

## 2020-10-31 ENCOUNTER — Encounter (HOSPITAL_COMMUNITY): Payer: Self-pay | Admitting: Emergency Medicine

## 2020-10-31 ENCOUNTER — Emergency Department (HOSPITAL_COMMUNITY): Payer: Medicaid Other

## 2020-10-31 ENCOUNTER — Other Ambulatory Visit: Payer: Self-pay

## 2020-10-31 DIAGNOSIS — W2209XA Striking against other stationary object, initial encounter: Secondary | ICD-10-CM | POA: Insufficient documentation

## 2020-10-31 DIAGNOSIS — M79674 Pain in right toe(s): Secondary | ICD-10-CM

## 2020-10-31 DIAGNOSIS — R456 Violent behavior: Secondary | ICD-10-CM | POA: Diagnosis not present

## 2020-10-31 DIAGNOSIS — Y92009 Unspecified place in unspecified non-institutional (private) residence as the place of occurrence of the external cause: Secondary | ICD-10-CM | POA: Diagnosis not present

## 2020-10-31 DIAGNOSIS — J45909 Unspecified asthma, uncomplicated: Secondary | ICD-10-CM | POA: Diagnosis not present

## 2020-10-31 DIAGNOSIS — S92424A Nondisplaced fracture of distal phalanx of right great toe, initial encounter for closed fracture: Secondary | ICD-10-CM | POA: Diagnosis not present

## 2020-10-31 DIAGNOSIS — Z20822 Contact with and (suspected) exposure to covid-19: Secondary | ICD-10-CM | POA: Diagnosis not present

## 2020-10-31 DIAGNOSIS — F840 Autistic disorder: Secondary | ICD-10-CM | POA: Diagnosis not present

## 2020-10-31 DIAGNOSIS — R451 Restlessness and agitation: Secondary | ICD-10-CM | POA: Diagnosis not present

## 2020-10-31 DIAGNOSIS — S99921A Unspecified injury of right foot, initial encounter: Secondary | ICD-10-CM | POA: Diagnosis present

## 2020-10-31 DIAGNOSIS — R4689 Other symptoms and signs involving appearance and behavior: Secondary | ICD-10-CM | POA: Diagnosis present

## 2020-10-31 DIAGNOSIS — Z7951 Long term (current) use of inhaled steroids: Secondary | ICD-10-CM | POA: Diagnosis not present

## 2020-10-31 DIAGNOSIS — F902 Attention-deficit hyperactivity disorder, combined type: Secondary | ICD-10-CM | POA: Diagnosis not present

## 2020-10-31 DIAGNOSIS — Z79899 Other long term (current) drug therapy: Secondary | ICD-10-CM | POA: Diagnosis not present

## 2020-10-31 MED ORDER — IBUPROFEN 800 MG PO TABS
800.0000 mg | ORAL_TABLET | Freq: Once | ORAL | Status: AC
  Administered 2020-10-31: 800 mg via ORAL
  Filled 2020-10-31: qty 1
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
## 2020-10-31 NOTE — ED Notes (Addendum)
HIPPA compliant voicemail left at group home with call back number.

## 2020-10-31 NOTE — ED Notes (Signed)
Patient's guardian made aware patient is up for discharge and will be transported back to group home. Attempted to call group home number provided by patient without answer.

## 2020-10-31 NOTE — ED Notes (Signed)
Assumed care of patient from [place], RN.   

## 2020-10-31 NOTE — Discharge Instructions (Addendum)
Your x-ray today shows that you have a fracture of the bone in your big toe.  Please wear the shoe provided, keep your toes taped together.  Please make sure to follow-up with orthopedics in a week or 2.  Referral is provided in your discharge paperwork.  Take Tylenol or ibuprofen for pain.  Please make sure to follow-up with your psychiatrist this week. Return to the ER for any new or worsening symptoms.  For your behavioral health needs, you are advised to continue treatment with your current outpatient providers at your earliest opportunity.
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
## 2020-11-01 ENCOUNTER — Encounter (HOSPITAL_COMMUNITY): Payer: Self-pay | Admitting: Registered Nurse

## 2020-11-01 ENCOUNTER — Other Ambulatory Visit: Payer: Self-pay

## 2020-11-01 DIAGNOSIS — R4689 Other symptoms and signs involving appearance and behavior: Secondary | ICD-10-CM | POA: Diagnosis present

## 2020-11-01 LAB — RESPIRATORY PANEL BY RT PCR (FLU A&B, COVID)
Influenza A by PCR: NEGATIVE
Influenza B by PCR: NEGATIVE
SARS Coronavirus 2 by RT PCR: NEGATIVE

## 2020-11-01 MED ORDER — IBUPROFEN 200 MG PO TABS
600.0000 mg | ORAL_TABLET | Freq: Three times a day (TID) | ORAL | Status: DC | PRN
  Administered 2020-11-03: 600 mg via ORAL
  Filled 2020-11-01: qty 3

## 2020-11-01 MED ORDER — DIVALPROEX SODIUM 500 MG PO DR TAB
500.0000 mg | DELAYED_RELEASE_TABLET | Freq: Two times a day (BID) | ORAL | Status: DC
  Administered 2020-11-01 – 2020-11-03 (×6): 500 mg via ORAL
  Filled 2020-11-01 (×6): qty 1

## 2020-11-01 MED ORDER — ONDANSETRON HCL 4 MG PO TABS: 4.0000 mg | ORAL_TABLET | Freq: Three times a day (TID) | ORAL | Status: DC | PRN

## 2020-11-01 MED ORDER — MONTELUKAST SODIUM 10 MG PO TABS
10.0000 mg | ORAL_TABLET | Freq: Every day | ORAL | Status: DC
  Administered 2020-11-01 – 2020-11-03 (×3): 10 mg via ORAL
  Filled 2020-11-01 (×3): qty 1

## 2020-11-01 MED ORDER — OLANZAPINE 10 MG PO TABS
10.0000 mg | ORAL_TABLET | Freq: Every day | ORAL | Status: DC
  Administered 2020-11-01 – 2020-11-03 (×3): 10 mg via ORAL
  Filled 2020-11-01 (×3): qty 1

## 2020-11-01 MED ORDER — ALUM & MAG HYDROXIDE-SIMETH 200-200-20 MG/5ML PO SUSP: 30.0000 mL | Freq: Four times a day (QID) | ORAL | Status: DC | PRN

## 2020-11-01 MED ORDER — GABAPENTIN 100 MG PO CAPS: 100.0000 mg | ORAL_CAPSULE | Freq: Three times a day (TID) | ORAL | Status: DC | PRN

## 2020-11-01 MED ORDER — ROSUVASTATIN CALCIUM 20 MG PO TABS
20.0000 mg | ORAL_TABLET | Freq: Every day | ORAL | Status: DC
  Administered 2020-11-01 – 2020-11-03 (×3): 20 mg via ORAL
  Filled 2020-11-01 (×3): qty 1

## 2020-11-01 MED ORDER — ARIPIPRAZOLE 10 MG PO TABS
10.0000 mg | ORAL_TABLET | Freq: Every day | ORAL | Status: DC
  Administered 2020-11-01 – 2020-11-03 (×3): 10 mg via ORAL
  Filled 2020-11-01 (×3): qty 1

## 2020-11-01 MED ORDER — TRAZODONE HCL 50 MG PO TABS
50.0000 mg | ORAL_TABLET | Freq: Every day | ORAL | Status: DC
  Administered 2020-11-01 – 2020-11-03 (×3): 50 mg via ORAL
  Filled 2020-11-01 (×3): qty 1

## 2020-11-01 MED ORDER — ESCITALOPRAM OXALATE 10 MG PO TABS
20.0000 mg | ORAL_TABLET | Freq: Every day | ORAL | Status: DC
  Administered 2020-11-01 – 2020-11-03 (×3): 20 mg via ORAL
  Filled 2020-11-01 (×3): qty 2

## 2020-11-01 NOTE — Progress Notes (Addendum)
CSW called pt's father and legal guardian ([facility] guardianship paperwork in pt's EHR chart) Hank Walling at ph:     [phone]   In order to facilitate pt's return tonight, if possible, or to ascertain any barriers that could be overcome and left a HIPPA-compliant VM requesting a call back.  Per disposition, [facility] staff member Okey Regal was supposed to call back and did not.  CSW will continue to follow for D/C needs.  Dorothe Pea. Renisha Cockrum  MSW, LCSW, LCAS, CCS Transitions of Care Clinical Social Worker Care Coordination Department Ph: [phone]

## 2020-11-01 NOTE — ED Notes (Signed)
Group home provider has now IVC'd patient. Providers made aware.
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
## 2020-11-01 NOTE — ED Notes (Signed)
TTS at bedside. 
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
## 2020-11-01 NOTE — ED Notes (Signed)
TTS in with patient now 

## 2020-11-01 NOTE — ED Provider Notes (Addendum)
Emergency Medicine Observation Re-evaluation Note  Derrick Nguyen is a 19 y.o. male, seen on rounds today.  Pt initially presented to the ED for complaints of Medical Clearance Currently, the patient is in no acute distress.  Physical Exam  BP (!) 120/51 (BP Location: Left Arm)   Pulse 76   Temp 97.6 F (36.4 C) (Oral)   Resp 20   SpO2 95%  Physical Exam General: Calm, sleeping Cardiac: Regular rate Lungs: No stridor normal respiratory effort Psych: Calm, sleeping  ED Course / MDM  EKG:    I have reviewed the labs performed to date as well as medications administered while in observation.  Recent changes in the last 24 hours include patient was assessed by TTS.  Plan was to have patient reassessed by psychiatry this morning.  Plan  Current plan is for psychiatry assessment determine disposition. Patient is under full IVC at this time.   Linwood Dibbles, MD 11/01/20 0902  Cleared from psychiatry standpoint.  Not ready for discharge to group home.  Social work consult requested by psychiatry.    Linwood Dibbles, MD 11/01/20 [phone]
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
## 2020-11-02 NOTE — Progress Notes (Signed)
TOC CM/CSW reached out [place] Huff/Caregiver [phone].  CSW left HIPPA compliant message with my contact information.    CSW received a call from [facility] [phone] or [phone].  Camelia Eng will be visiting pt today to assess crisis plan.    CSW reached out to [facility] [phone] and spoke with  Dorma Russell.  CSW will continue to follow for dc needs.  Ahmar Pickrell Tarpley-Carter, MSW, LCSW-A Pronouns:  She, Her, Hers                  [facility] Transitions of CareClinical Social Worker [email or web address] [phone]

## 2020-11-02 NOTE — Progress Notes (Signed)
TOC CM/CSW received a return call from [facility] at [employer] [phone].  Eber Jones stated that she would pick up pt on Wednesday, 11/03/2020 before 12pm. Eber Jones also mentioned that the gentlemen fixing the windows will get off work at [employer] and come there today to fix the windows.  Eber Jones then stated, "pt would have a safe environment to come back to".  CSW will continue to follow for dc needs.  Charina Fons Tarpley-Carter, MSW, LCSW-A Pronouns:  She, Her, Hers                  [facility] Transitions of CareClinical Social Worker [email or web address] [phone]

## 2020-11-02 NOTE — Progress Notes (Addendum)
TOC CM/CSW attempted to contact [facility] [phone].  CSW has been waiting on a return call from [facility] on a status update from [employer] or facility admin. CSW left HIPPA compliant message with my contact information.  CSW will continue to follow for dc needs.  Lenah Messenger Tarpley-Carter, MSW, LCSW-A Pronouns:  She, Her, Hers                  [facility] Transitions of CareClinical Social Worker [email or web address] [phone]

## 2020-11-02 NOTE — BH Assessment (Addendum)
BHH Assessment Progress Note  Pt remains under IVC until his AFL makes arrangements to pick him up.  At 09:53 this Clinical research associate called Latanya Maudlin, pt's crisis provider, at [phone].  He reports that he has been unable to reach AFL to finalize plans, but agrees to call me back after speaking to them. Discharge instructions continue to advise pt to continue treatment with his current outpatient provider.  EDP Margarita Grizzle, MD, has been notified, along with Diane, RN, Ricquita Tarpley-Carter, CSW and Isidoro Donning, RN.  Doylene Canning, [place] [facility] Coordinator [phone]    Addendum:  At 12:14 Dorma Russell calls back to report that pt's AFL will be ready to take him back tomorrow at 12:00.  Pt will remain under IVC in the meantime.  Pertinent parties noted above have been notified.  Doylene Canning, [place] [facility] Coordinator [phone]

## 2020-11-03 NOTE — Progress Notes (Addendum)
TOC CM left message for group home, Dietitian at [employer] [phone]. Waiting call back, pt has been dc and will need transportation back to facility. Isidoro Donning RN CCM, [facility] ED TOC CM [phone]  1400 TOC CM received call back from [facility] stating she will pick pt up around 3 pm. She is still having work done to pt's window in his room. The plexiglass is too large and they repair person had to come back to day to complete window. Received call from [place], [phone] [employer]. Requesting paperwork be completed for [employer] Waiver. States he will need to be called when completed to pick up. He needs the original forms. Will have the MD complete. Isidoro Donning RN CCM, [facility] ED TOC CM [phone]

## 2020-11-03 NOTE — Progress Notes (Signed)
TOC CM/CSW attempted to contact Eber Jones Huff/Caregiver at [employer] [phone], for an update on status.  To verify the information that was stated yesterday.  CSW left HIPPA compliant message with my contact information.  CSW will continue to attempt to contact [place].  CSW will continue to follow for dc needs.  Areana Kosanke Tarpley-Carter, MSW, LCSW-A Pronouns:  She, Her, Hers                  [facility] Transitions of CareClinical Social Worker [email or web address] [phone]

## 2020-11-03 NOTE — ED Notes (Addendum)
RN spoke with [name] regarding pts pending discharge status. Per [name], they are in the process of repairing a window in pts room so they have to wait until the window is fixed before they are able to come pick the pt up.  RN again verified that pt will be picked up tonight from [facility], to which [name] stated that he would in fact be picked up tonight, once the window is fixed. Pt updated as to the delay in discharge.

## 2020-11-03 NOTE — BH Assessment (Addendum)
BHH Assessment Progress Note  At 09:57 this Clinical research associate called pt's crisis provider where I have been speaking to [employer] over the past few days.  This is to verify that staff at pt's AFL will coming to [facility] to pick him up at 12:00 today, as previously planned.  Call rolled to voice mail and I left a message requesting return call.  Response is pending as of this writing.  Doylene Canning, [place] [facility] Coordinator [phone]   Addendum:  At 11:10 I spoke to Ronnie Doss with [facility], pt's crisis provider.  She confirms that pt's AFL provider will be at [facility] to pick pt up at noon.  Please inform me of their arrival.  IVC will be rescinded when pt is ready to depart.  EDP Marianna Fuss, MD and pt's nurse Carolynne, have been notified.  Doylene Canning, [place] [facility] Coordinator [phone]

## 2020-11-03 NOTE — ED Notes (Signed)
RN tried to call group home for update if he will be pick up tonight. Group home not answering. RN explain patient that he might stay tonight and will be discharge in A.M . Pt is upset but calm and cooperative. Will continue to monitor.

## 2020-11-03 NOTE — ED Notes (Signed)
Pt calm and cooperative at this time, no needs expressed.  Sitter at bedside. Will continue to monitor.

## 2020-11-03 NOTE — BH Assessment (Signed)
BHH Assessment Progress Note  Around 14:15 this Clinical research associate received a call from [employer].  She reports that pt's caregiver missed the appointment to pick pt up.  She will be here at 15:00 for him instead.  At 14:20 I received another call from Latanya Maudlin.  He reports that they have a form that will need to be filled out before they agree to take custody of pt.  It cannot be faxed or sent by e-mail attachment, but he will be bringing it to [facility] in person.  Dorma Russell was given phone number for Isidoro Donning, RN for follow up.  Cathlean Cower, EDP Marianna Fuss, MD and [place], RN have all been informed.  Doylene Canning, [place] [facility] Coordinator [phone]

## 2020-11-29 ENCOUNTER — Other Ambulatory Visit: Payer: Medicaid Other

## 2020-11-29 DIAGNOSIS — Z20822 Contact with and (suspected) exposure to covid-19: Secondary | ICD-10-CM

## 2020-12-01 LAB — NOVEL CORONAVIRUS, NAA: SARS-CoV-2, NAA: NOT DETECTED

## 2020-12-01 LAB — SARS-COV-2, NAA 2 DAY TAT

## 2020-12-05 ENCOUNTER — Emergency Department (HOSPITAL_COMMUNITY)
Admission: EM | Admit: 2020-12-05 | Discharge: 2020-12-08 | Disposition: A | Discharge status: Home / Self Care | Payer: Medicaid Other | Attending: Emergency Medicine | Admitting: Emergency Medicine

## 2020-12-05 ENCOUNTER — Other Ambulatory Visit: Payer: Self-pay

## 2020-12-05 DIAGNOSIS — S0990XA Unspecified injury of head, initial encounter: Secondary | ICD-10-CM | POA: Diagnosis present

## 2020-12-05 DIAGNOSIS — F840 Autistic disorder: Secondary | ICD-10-CM | POA: Diagnosis not present

## 2020-12-05 DIAGNOSIS — S61212A Laceration without foreign body of right middle finger without damage to nail, initial encounter: Secondary | ICD-10-CM | POA: Insufficient documentation

## 2020-12-05 DIAGNOSIS — R456 Violent behavior: Secondary | ICD-10-CM | POA: Diagnosis not present

## 2020-12-05 DIAGNOSIS — R4689 Other symptoms and signs involving appearance and behavior: Secondary | ICD-10-CM | POA: Diagnosis present

## 2020-12-05 DIAGNOSIS — S0101XA Laceration without foreign body of scalp, initial encounter: Secondary | ICD-10-CM | POA: Insufficient documentation

## 2020-12-05 DIAGNOSIS — Z7289 Other problems related to lifestyle: Secondary | ICD-10-CM

## 2020-12-05 DIAGNOSIS — X789XXA Intentional self-harm by unspecified sharp object, initial encounter: Secondary | ICD-10-CM | POA: Diagnosis not present

## 2020-12-05 DIAGNOSIS — Z20822 Contact with and (suspected) exposure to covid-19: Secondary | ICD-10-CM | POA: Diagnosis not present

## 2020-12-05 DIAGNOSIS — S61214A Laceration without foreign body of right ring finger without damage to nail, initial encounter: Secondary | ICD-10-CM | POA: Insufficient documentation

## 2020-12-05 DIAGNOSIS — S61210A Laceration without foreign body of right index finger without damage to nail, initial encounter: Secondary | ICD-10-CM | POA: Insufficient documentation

## 2020-12-05 DIAGNOSIS — J45909 Unspecified asthma, uncomplicated: Secondary | ICD-10-CM | POA: Insufficient documentation

## 2020-12-05 DIAGNOSIS — F909 Attention-deficit hyperactivity disorder, unspecified type: Secondary | ICD-10-CM | POA: Diagnosis not present

## 2020-12-05 LAB — BASIC METABOLIC PANEL
Anion gap: 12 (ref 5–15)
BUN: 17 mg/dL (ref 6–20)
CO2: 23 mmol/L (ref 22–32)
Calcium: 9.2 mg/dL (ref 8.9–10.3)
Chloride: 107 mmol/L (ref 98–111)
Creatinine, Ser: 0.71 mg/dL (ref 0.61–1.24)
GFR, Estimated: >60 mL/min (ref >60)
Glucose, Bld: 98 mg/dL (ref 70–99)
Potassium: 4 mmol/L (ref 3.5–5.1)
Sodium: 142 mmol/L (ref 135–145)

## 2020-12-05 LAB — CBC WITH DIFFERENTIAL/PLATELET
Abs Immature Granulocytes: 0.02 10*3/uL (ref 0.00–0.07)
Basophils Absolute: 0 10*3/uL (ref 0.0–0.1)
Basophils Relative: 1 %
Eosinophils Absolute: 0.1 10*3/uL (ref 0.0–0.5)
Eosinophils Relative: 1 %
HCT: 40.6 % (ref 39.0–52.0)
Hemoglobin: 13.9 g/dL (ref 13.0–17.0)
Immature Granulocytes: 0 %
Lymphocytes Relative: 28 %
Lymphs Abs: 2 10*3/uL (ref 0.7–4.0)
MCH: 32.1 pg (ref 26.0–34.0)
MCHC: 34.2 g/dL (ref 30.0–36.0)
MCV: 93.8 fL (ref 80.0–100.0)
Monocytes Absolute: 1.2 10*3/uL — ABNORMAL HIGH (ref 0.1–1.0)
Monocytes Relative: 16 %
Neutro Abs: 3.9 10*3/uL (ref 1.7–7.7)
Neutrophils Relative %: 54 %
Platelets: 160 10*3/uL (ref 150–400)
RBC: 4.33 MIL/uL (ref 4.22–5.81)
RDW: 12 % (ref 11.5–15.5)
WBC: 7.2 10*3/uL (ref 4.0–10.5)
nRBC: 0 % (ref 0.0–0.2)

## 2020-12-05 LAB — RAPID URINE DRUG SCREEN, HOSP PERFORMED
Amphetamines: NOT DETECTED
Barbiturates: NOT DETECTED
Benzodiazepines: NOT DETECTED
Cocaine: NOT DETECTED
Opiates: NOT DETECTED
Tetrahydrocannabinol: NOT DETECTED

## 2020-12-05 LAB — ETHANOL: Alcohol, Ethyl (B): <10 mg/dL (ref <10)

## 2020-12-05 LAB — RESP PANEL BY RT-PCR (FLU A&B, COVID) ARPGX2
Influenza A by PCR: NEGATIVE
Influenza B by PCR: NEGATIVE
SARS Coronavirus 2 by RT PCR: NEGATIVE

## 2020-12-05 MED ORDER — ROSUVASTATIN CALCIUM 20 MG PO TABS
20.0000 mg | ORAL_TABLET | Freq: Every day | ORAL | Status: DC
  Administered 2020-12-05 – 2020-12-08 (×4): 20 mg via ORAL
  Filled 2020-12-05 (×4): qty 1

## 2020-12-05 MED ORDER — ARIPIPRAZOLE 10 MG PO TABS
10.0000 mg | ORAL_TABLET | Freq: Every day | ORAL | Status: DC
  Administered 2020-12-05 – 2020-12-08 (×4): 10 mg via ORAL
  Filled 2020-12-05 (×4): qty 1

## 2020-12-05 MED ORDER — DIVALPROEX SODIUM 500 MG PO DR TAB
500.0000 mg | DELAYED_RELEASE_TABLET | Freq: Two times a day (BID) | ORAL | Status: DC
  Administered 2020-12-05 – 2020-12-08 (×7): 500 mg via ORAL
  Filled 2020-12-05 (×7): qty 1

## 2020-12-05 MED ORDER — ESCITALOPRAM OXALATE 10 MG PO TABS
20.0000 mg | ORAL_TABLET | Freq: Every day | ORAL | Status: DC
  Administered 2020-12-05 – 2020-12-08 (×4): 20 mg via ORAL
  Filled 2020-12-05 (×4): qty 2

## 2020-12-05 MED ORDER — MONTELUKAST SODIUM 10 MG PO TABS
10.0000 mg | ORAL_TABLET | Freq: Every day | ORAL | Status: DC
  Administered 2020-12-06 – 2020-12-07 (×2): 10 mg via ORAL
  Filled 2020-12-05 (×4): qty 1

## 2020-12-05 MED ORDER — HYDROXYZINE HCL 25 MG PO TABS
25.0000 mg | ORAL_TABLET | Freq: Four times a day (QID) | ORAL | Status: DC | PRN
  Administered 2020-12-08: 08:00:00 25 mg via ORAL
  Filled 2020-12-05: qty 1

## 2020-12-05 MED ORDER — OLANZAPINE 10 MG PO TABS
10.0000 mg | ORAL_TABLET | Freq: Every day | ORAL | Status: DC
  Administered 2020-12-06 – 2020-12-07 (×3): 10 mg via ORAL
  Filled 2020-12-05 (×3): qty 1

## 2020-12-05 MED ORDER — ALBUTEROL SULFATE HFA 108 (90 BASE) MCG/ACT IN AERS: 2.0000 | INHALATION_SPRAY | Freq: Four times a day (QID) | RESPIRATORY_TRACT | Status: DC | PRN

## 2020-12-05 MED ORDER — TRAZODONE HCL 50 MG PO TABS
50.0000 mg | ORAL_TABLET | Freq: Every day | ORAL | Status: DC
  Administered 2020-12-06 – 2020-12-07 (×3): 50 mg via ORAL
  Filled 2020-12-05 (×3): qty 1

## 2020-12-05 MED ORDER — GABAPENTIN 100 MG PO CAPS: 100.0000 mg | ORAL_CAPSULE | Freq: Three times a day (TID) | ORAL | Status: DC | PRN

## 2020-12-05 NOTE — Progress Notes (Signed)
TOC CM/CSW attempted to contact David Ohlsen/legal guardian [phone] and Victor Barnes/caregiver [phone].  CSW left HIPPA compliant message with my contact information.  CSW will continue to follow for dc needs.  Melville Engen Tarpley-Carter, MSW, LCSW-A Pronouns:  She, Her, Hers                  [facility] ED Transitions of CareClinical Social Worker [email or web address] [phone] 

## 2020-12-05 NOTE — ED Notes (Signed)
TTS monitor at bedside 
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
## 2020-12-05 NOTE — ED Notes (Signed)
Pt ambulatory to bathroom, no assistance needed.  

## 2020-12-05 NOTE — ED Notes (Signed)
Pt calm and cooperative at this time, no needs expressed. Will continue to monitor.

## 2020-12-05 NOTE — ED Notes (Signed)
TTS in progress 
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
## 2020-12-05 NOTE — ED Notes (Signed)
Pt calm and cooperative, requesting something to eat.  Pt will be provided dinner tray when they arrive to unit.

## 2020-12-05 NOTE — ED Notes (Signed)
Pt remains in bed , no complaints. Pt is watching tv no signs of distress will continue to monitor.

## 2020-12-05 NOTE — Consult Note (Signed)
Tele Assessment Patient was previously assessed at 0658 this morning by psychiatry.  This is a same day follow-up as the patient has not demonstrated concerns since ED arrival.  Derrick Nguyen, 19 y.o., male patient seen via telepsych by this provider; chart reviewed and consulted with Dr. Jannifer Franklin on 12/05/20.  On evaluation Derrick Nguyen reports , "I am okay."  When asked about events that led to his current hospitalization he states, " I don't remember." He denies safety concerns.  States he likes his living arrangements and requesting to go home.  Pr record review, the patient was calm when taken into police custody earlier this morning.  Since arrival at the hospital, he has been calm and cooperative with staff.  He has not had moments of agitation and no aggressive behaviors warranting prn medication or physical restraints.    During evaluation Derrick Nguyen is seated in upright position on the hosptial bed.  He is alert/oriented to person and place; calm/cooperative; and mood congruent with affect.  Patient is speaking in a clear tone at moderate volume, and normal pace; with fair eye contact.  His thought process is coherent and relevant; There is no indication that he is currently responding to internal/external stimuli or experiencing delusional thought content.  Patient denies suicidal/self-harm homicidal ideation, psychosis, and paranoia.  Patient has remained calm throughout assessment and has answered questions according to his ability.    Disposition: No evidence of imminent risk to self or others at present.   Patient does not meet criteria for psychiatric inpatient admission.  Recommend patient to be restarted on his medications and observed overnight for safety.  If no additional concerns arise, can be reassessed in the AM for potential discharge.  I have discussed this recommendation with the social worker who has unsuccessfully attempted to contact his caregiver the IVC  petitioner.   Ophelia Shoulder, PMHNP

## 2020-12-05 NOTE — ED Notes (Signed)
Pt asleep, equal chest rise noted.  Will continue to monitor.  

## 2020-12-05 NOTE — ED Notes (Signed)
[facility] contact with pt. Pt is calm and watching tv, no signs of distress. Will continue to monitor

## 2020-12-05 NOTE — ED Notes (Signed)
Pt I sleeping at bedside, easily awakened. Pt states he doesn't need anything, will continue to monitor.

## 2020-12-05 NOTE — Progress Notes (Signed)
TOC CM/CSW attempted to contact Onalee Hua Wandler/legal guardian [phone] and Hillard Danker [phone].  CSW left HIPPA compliant message with my contact information.  CSW will continue to follow for dc needs.  Damen Windsor Tarpley-Carter, MSW, LCSW-A Pronouns:  She, Her, Hers                  [facility] Transitions of CareClinical Social Worker [email or web address] [phone]

## 2020-12-05 NOTE — ED Notes (Signed)
No change in pt's status,will continue to monitor.

## 2020-12-05 NOTE — ED Notes (Signed)
Pt continues to lay in bed watching tv. When asked pt states he doesn't need anything. No complaints or signs of distress. Will continue to monitor.

## 2020-12-06 DIAGNOSIS — F840 Autistic disorder: Secondary | ICD-10-CM

## 2020-12-06 NOTE — ED Notes (Signed)
Pt has been medicated per MD orders, pt tolerated well. Will continue to monitor.

## 2020-12-06 NOTE — Progress Notes (Signed)
TOC CM/CSW has attempted to contact the following since 12pm today in regards to dc planning for pt to [facility].  [address] Harle Stanford and Latanya Maudlin [phone]  [facility] (Facility where Spicer and Latanya Maudlin work)  [phone]  [facility] Emergency Line [phone]  [name] [phone]  Latanya Maudlin (Personal Cell) [phone]  Angie Nigel Bridgeman [facility]) [phone]  Tim O'Toole/[facility] [phone]  [facility] [phone]  CSW will continue to follow for dc needs.  Jaeven Wanzer Tarpley-Carter, MSW, LCSW-A Pronouns:  She, Her, Hers                  [facility] Transitions of CareClinical Social Worker [email or web address] [phone]

## 2020-12-06 NOTE — ED Notes (Signed)
Pt continues to rest comfortably at bedside ( sleeping) will continue to monitor.

## 2020-12-06 NOTE — Consult Note (Signed)
[facility] Psych ED Discharge  12/06/2020 12:13 PM Derrick Nguyen  MRN:  [id number] Principal Problem: Autism spectrum disorder Discharge Diagnoses: Principal Problem:   Autism spectrum disorder Active Problems:   Aggressive behavior   Subjective: Patient states "I got mad about my family leaving."  Patient reports readiness to discharge to [facility] facility today.  Patient reports current stressors include family visiting and subsequently leaving this caused him to feel upset and he broke a window at the [facility] where he resides.  Patient resides in [place] at an [facility] with his caseworker Eber Jones.  Patient denies access to weapons.  Patient reports he is not currently employed.  Patient denies alcohol and substance use.  Patient endorses average sleep and appetite.  Patient is followed by outpatient psychiatry, Dr. Erenest Rasher as well as therapist, Kandace Parkins.  Patient reports he is treated for mood disorder and reports compliance with all medications.  Patient assessed by nurse practitioner.  Patient pleasant cooperative during assessment.  Patient alert and oriented, answers appropriately.  Patient reports plan to reside at [facility] developmental center moving forward.  Patient states "I am supposed to go to [place] today."  Patient denies suicidal and homicidal ideations currently.  Patient reports history of suicide attempts, "a lot."  Patient denies both auditory and visual hallucinations.  There is no evidence of delusional thought content and no indication that patient is responding to internal stimuli.  Patient denies symptoms of paranoia.  Patient offered support and encouragement.  Patient gives verbal consent to speak with his father, Zephaniah Lubrano phone #[phone].  Spoke with patient's father who reports patient "knows what he is doing, he has done this 100s of times before, he will do anything that he can get out of going to [place]."  Patient's father denies concern for patient  safety.  Patient's father reports he is patient's legal guardian and would prefer for patient to discharge directly to [facility] in [place], [place].  Total Time spent with patient: 30 minutes  Past Psychiatric History: Autism spectrum disorder, aggressive behavior, suicidal ideation  Past Medical History:  Past Medical History:  Diagnosis Date  . ADHD (attention deficit hyperactivity disorder)   . Asthma   . Depression   . Oppositional defiant disorder    No past surgical history on file. Family History:  Family History  Family history unknown: Yes   Family Psychiatric  History: None reported Social History:  Social History   Substance and Sexual Activity  Alcohol Use Never     Social History   Substance and Sexual Activity  Drug Use Never    Social History   Socioeconomic History  . Marital status: Single    Spouse name: Not on file  . Number of children: Not on file  . Years of education: Not on file  . Highest education level: Not on file  Occupational History  . Not on file  Tobacco Use  . Smoking status: Never Smoker  . Smokeless tobacco: Never Used  Substance and Sexual Activity  . Alcohol use: Never  . Drug use: Never  . Sexual activity: Not on file  Other Topics Concern  . Not on file  Social History Narrative  . Not on file   Social Determinants of Health   Financial Resource Strain: Not on file  Food Insecurity: Not on file  Transportation Needs: Not on file  Physical Activity: Not on file  Stress: Not on file  Social Connections: Not on file    Has  this patient used any form of tobacco in the last 30 days? (Cigarettes, Smokeless Tobacco, Cigars, and/or Pipes) A prescription for an FDA-approved tobacco cessation medication was offered at discharge and the patient refused  Current Medications: Current Facility-Administered Medications  Medication Dose Route Frequency Provider Last Rate Last Admin  . albuterol  (VENTOLIN HFA) 108 (90 Base) MCG/ACT inhaler 2 puff  2 puff Inhalation Q6H PRN Molpus, John, MD      . ARIPiprazole (ABILIFY) tablet 10 mg  10 mg Oral Daily Molpus, John, MD   10 mg at 12/06/20 0939  . divalproex (DEPAKOTE) DR tablet 500 mg  500 mg Oral BID Molpus, John, MD   500 mg at 12/06/20 8588  . escitalopram (LEXAPRO) tablet 20 mg  20 mg Oral QPC breakfast Molpus, John, MD   20 mg at 12/06/20 0939  . gabapentin (NEURONTIN) capsule 100 mg  100 mg Oral TID PRN Molpus, John, MD      . hydrOXYzine (ATARAX/VISTARIL) tablet 25 mg  25 mg Oral Q6H PRN Molpus, John, MD      . montelukast (SINGULAIR) tablet 10 mg  10 mg Oral QHS Molpus, John, MD      . OLANZapine (ZYPREXA) tablet 10 mg  10 mg Oral QHS Molpus, John, MD   10 mg at 12/06/20 0037  . rosuvastatin (CRESTOR) tablet 20 mg  20 mg Oral Daily Molpus, John, MD   20 mg at 12/05/20 0915  . traZODone (DESYREL) tablet 50 mg  50 mg Oral QHS Molpus, John, MD   50 mg at 12/06/20 0037   Current Outpatient Medications  Medication Sig Dispense Refill  . albuterol (VENTOLIN HFA) 108 (90 Base) MCG/ACT inhaler Inhale 2 puffs into the lungs every 6 (six) hours as needed for wheezing or shortness of breath.     . ARIPiprazole (ABILIFY) 10 MG tablet Take 10 mg by mouth daily.    . divalproex (DEPAKOTE) 500 MG DR tablet Take 500 mg by mouth 2 (two) times daily.    Marland Kitchen escitalopram (LEXAPRO) 20 MG tablet Take 20 mg by mouth daily after breakfast.     . gabapentin (NEURONTIN) 100 MG capsule Take 100 mg by mouth 3 (three) times daily as needed (pain).    . hydrOXYzine (VISTARIL) 25 MG capsule Take 25 mg by mouth at bedtime.    Marland Kitchen ketoconazole (NIZORAL) 2 % shampoo Apply 1 application topically once a week.    . montelukast (SINGULAIR) 10 MG tablet Take 10 mg by mouth at bedtime.    Marland Kitchen OLANZapine (ZYPREXA) 10 MG tablet Take 10 mg by mouth at bedtime.    . rosuvastatin (CRESTOR) 20 MG tablet Take 20 mg by mouth daily.    . traZODone (DESYREL) 50 MG tablet Take 50 mg  by mouth at bedtime.     PTA Medications: (Not in a hospital admission)   Musculoskeletal: Strength & Muscle Tone: within normal limits Gait & Station: normal Patient leans: N/A  Psychiatric Specialty Exam: Physical Exam Vitals and nursing note reviewed.  Constitutional:      Appearance: He is well-developed.  HENT:     Head: Normocephalic.  Cardiovascular:     Rate and Rhythm: Normal rate.  Pulmonary:     Effort: Pulmonary effort is normal.  Neurological:     Mental Status: He is alert and oriented to person, place, and time.  Psychiatric:        Attention and Perception: Attention and perception normal.        Mood and  Affect: Mood and affect normal.        Speech: Speech normal.        Behavior: Behavior normal. Behavior is cooperative.        Thought Content: Thought content normal.        Cognition and Memory: Cognition and memory normal.        Judgment: Judgment normal.     Review of Systems  Constitutional: Negative.   HENT: Negative.   Eyes: Negative.   Respiratory: Negative.   Cardiovascular: Negative.   Gastrointestinal: Negative.   Genitourinary: Negative.   Musculoskeletal: Negative.   Skin: Negative.   Neurological: Negative.   Psychiatric/Behavioral: Negative.     Blood pressure (!) 128/57, pulse 60, temperature 98.1 F (36.7 C), temperature source Oral, resp. rate 17, height 6' (1.829 m), weight 102.1 kg, SpO2 97 %.Body mass index is 30.52 kg/m.  General Appearance: Casual and Fairly Groomed  Eye Contact:  Good  Speech:  Clear and Coherent and Normal Rate  Volume:  Normal  Mood:  Euthymic  Affect:  Congruent  Thought Process:  Coherent, Goal Directed and Descriptions of Associations: Intact  Orientation:  Full (Time, Place, and Person)  Thought Content:  Logical  Suicidal Thoughts:  No  Homicidal Thoughts:  No  Memory:  Immediate;   Fair Recent;   Fair Remote;   Fair  Judgement:  Fair  Insight:  Fair  Psychomotor Activity:  Normal   Concentration:  Concentration: Fair and Attention Span: Fair  Recall:  [employer] of Knowledge:  Fair  Language:  Fair  Akathisia:  No  Handed:  Right  AIMS (if indicated):     Assets:  Communication Skills Desire for Improvement Financial Resources/Insurance Housing Intimacy Leisure Time Physical Health Resilience Social Support Talents/Skills  ADL's:  Intact  Cognition:  WNL  Sleep:        Demographic Factors:  Male and Caucasian  Loss Factors: NA  Historical Factors: Prior suicide attempts  Risk Reduction Factors:   Living with another person, especially a relative, Positive social support, Positive therapeutic relationship and Positive coping skills or problem solving skills  Continued Clinical Symptoms:  Medical Diagnoses and Treatments/Surgeries  Cognitive Features That Contribute To Risk:  None    Suicide Risk:  Minimal: No identifiable suicidal ideation.  Patients presenting with no risk factors but with morbid ruminations; may be classified as minimal risk based on the severity of the depressive symptoms   Follow-up Information    [facility]ENCY DEPT In 7 days.   Specialty: Emergency Medicine Why: For suture removal Contact information: [address] [id number] mc [place] [address] [phone]              Plan Of Care/Follow-up recommendations:  Other:  Patient reviewed with Dr Bronwen Betters.  Social work consult initiated to assist with disposition.   Disposition: Patient cleared by psychiatry.  Patrcia Dolly, FNP 12/06/2020, 12:13 PM

## 2020-12-06 NOTE — ED Notes (Signed)
Pt given crackers and cheese. Pt is calm and cooperative.

## 2020-12-06 NOTE — ED Notes (Signed)
Pt is still sleeping will continue to make rounds and monitor. 

## 2020-12-06 NOTE — ED Notes (Signed)
TTS at bedside. 

## 2020-12-06 NOTE — Progress Notes (Signed)
..   Transition of Care [facility]) - Emergency Department Mini Assessment   Patient Details  Name: Derrick Nguyen MRN: [id number] Date of Birth: 15-Nov-2001  Transition of Care [facility]) CM/SW Contact:    Derrick Nguyen, LCSWA Phone Number: 12/06/2020, 4:04 PM   Clinical Narrative: [facility] CM/CSW has attempted to contact [facility] [phone].  CSW left HIPPA compliant message with my contact information.  [name], MSW, LCSW-A Pronouns:  She, Her, Hers                  [facility] Transitions of CareClinical Social Worker [email or web address] [phone]   ED Mini Assessment:    Barriers to Discharge: Transportation     Means of departure: Not know  Interventions which prevented an admission or readmission: BH return to placement after psych assessment    Patient Contact and Communications Key Contact 1: Latanya Maudlin [phone] Key Contact 2: Graciella Belton [phone] Spoke with: CSW spoke with both Dorma Russell and [facility]. Contact Date: 12/06/20,     Contact Phone Number: Marily Lente [phone] Call outcome: [facility] is trying to arrange transportation to [facility].      Choice offered to / list presented to : Parent Latanya Maudlin)  Admission diagnosis:  IVC: Hand injury Patient Active Problem List   Diagnosis Date Noted  . Aggressive behavior 11/01/2020  . Suicidal ideation 07/09/2020  . Autism spectrum disorder 07/09/2020   PCP:  Norm Salt, PA Pharmacy:   [facility] - [place], [place] - [address] [place] [address] Phone: [phone] Fax: [phone]

## 2020-12-06 NOTE — BH Assessment (Signed)
[facility] Assessment Progress Note  Per Berneice Heinrich, NP, this pt does not require psychiatric hospitalization at this time.  Pt presents under IVC initiated by pt's [facility] caretaker and upheld by EDP Meriel Flavors, MD, which has been rescinded by Nelly Rout, MD.  Pt is psychiatrically cleared.  Discharge instructions advise pt to follow up with his regular outpatient provider.  Inetta Fermo reports that she has spoken to pt's father/legal guardian, Floyed Masoud [phone]), informing him of pt's disposition.  He reportedly informed Inetta Fermo that pt is to go to [facility] for residential placement today.  Inetta Fermo has ordered at [facility] consult to facilitate transfer, and I have spoken to [employer], CSW about this.  EDP Alvira Monday, MD and pt's nurse, Norberta Keens, have been notified.  Doylene Canning, MA Triage Specialist [phone]

## 2020-12-06 NOTE — Progress Notes (Signed)
TOC CM/CSW has spoken with Hinda Kehr and Latanya Maudlin about resuming care for pt.  Crystal and Dorma Russell reports that they will be transporting pt to [facility] tomorrow (12/07/2020).  Crystal and/or Dorma Russell will be contacting 1st shift CSW at 10am on 12/07/2020 to update her on dc plan to [facility].  CSW will continue to follow for dc needs.  Dawn Convery Tarpley-Carter, MSW, LCSW-A Pronouns:  She, Her, Hers                  [facility] Transitions of CareClinical Social Worker [email or web address] [phone]

## 2020-12-06 NOTE — ED Notes (Signed)
Pt is sleeping at bedside. No complaints will continue to monitor.

## 2020-12-06 NOTE — Discharge Instructions (Signed)
For your behavioral health needs, you are advised to continue treatment with your regular outpatient provider. °

## 2020-12-06 NOTE — ED Notes (Signed)
Pt sleeping at bedside, no signs of distress,Will continue to monitor.

## 2020-12-06 NOTE — ED Notes (Signed)
Pt remains sleep at bedside. Will continue to monitor.

## 2020-12-06 NOTE — ED Notes (Signed)
Breakfast tray at bedside 

## 2020-12-07 NOTE — ED Notes (Signed)
Pt calm and cooperative. States that he is ready to return home.
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
## 2020-12-07 NOTE — Progress Notes (Signed)
TOC CM/CSW received a call from [employer] [phone].  Tim stated that he is working on providing transportation for pt to [employer] today.  Jorja Loa will continue to update CSW on his progress.  CSW will continue to follow for dc needs.  Sajan Cheatwood Tarpley-Carter, MSW, LCSW-A Pronouns:  She, Her, Hers                  [facility] Transitions of CareClinical Social Worker [email or web address] [phone]

## 2020-12-07 NOTE — Progress Notes (Signed)
TOC CM/CSW contacted [employer] [facility] [phone].  Dorma Russell stated due to a Christmas parade happening near the campus of [facility] it would prohibit them from gaining access to the [facility] campus.  Therefore, requesting another day for dc.  CSW disclosed to Dorma Russell that another day for dc is not acceptable, pt would need transportation to [facility] today.  Dorma Russell stated he would continue to work on providing transportation for pt to [employer] today.  CSW will continue to follow for dc needs.  Gibson Lad Tarpley-Carter, MSW, LCSW-A Pronouns:  She, Her, Hers                  [facility] Transitions of CareClinical Social Worker [email or web address] [phone]

## 2020-12-07 NOTE — Progress Notes (Addendum)
TOC CM/CSW received an email from [employer] in regards to pts LOC form that needs to be completed by an MD.  Dr. Cherlynn Perches assisted with this form.    CSW will continue to follow for dc needs.  Lachelle Rissler Tarpley-Carter, MSW, LCSW-A Pronouns:  She, Her, Hers                  [facility] Transitions of CareClinical Social Worker [email or web address] [phone]  @10 :30am   This information was faxed to .

## 2021-12-26 IMAGING — CR DG TOE GREAT 2+V*R*
3 series · 3 of 3 positions shown · non-contrast
Comparison: June 18, 2017

CLINICAL DATA: Pain

EXAM:
RIGHT GREAT TOE

[x toes ap right]
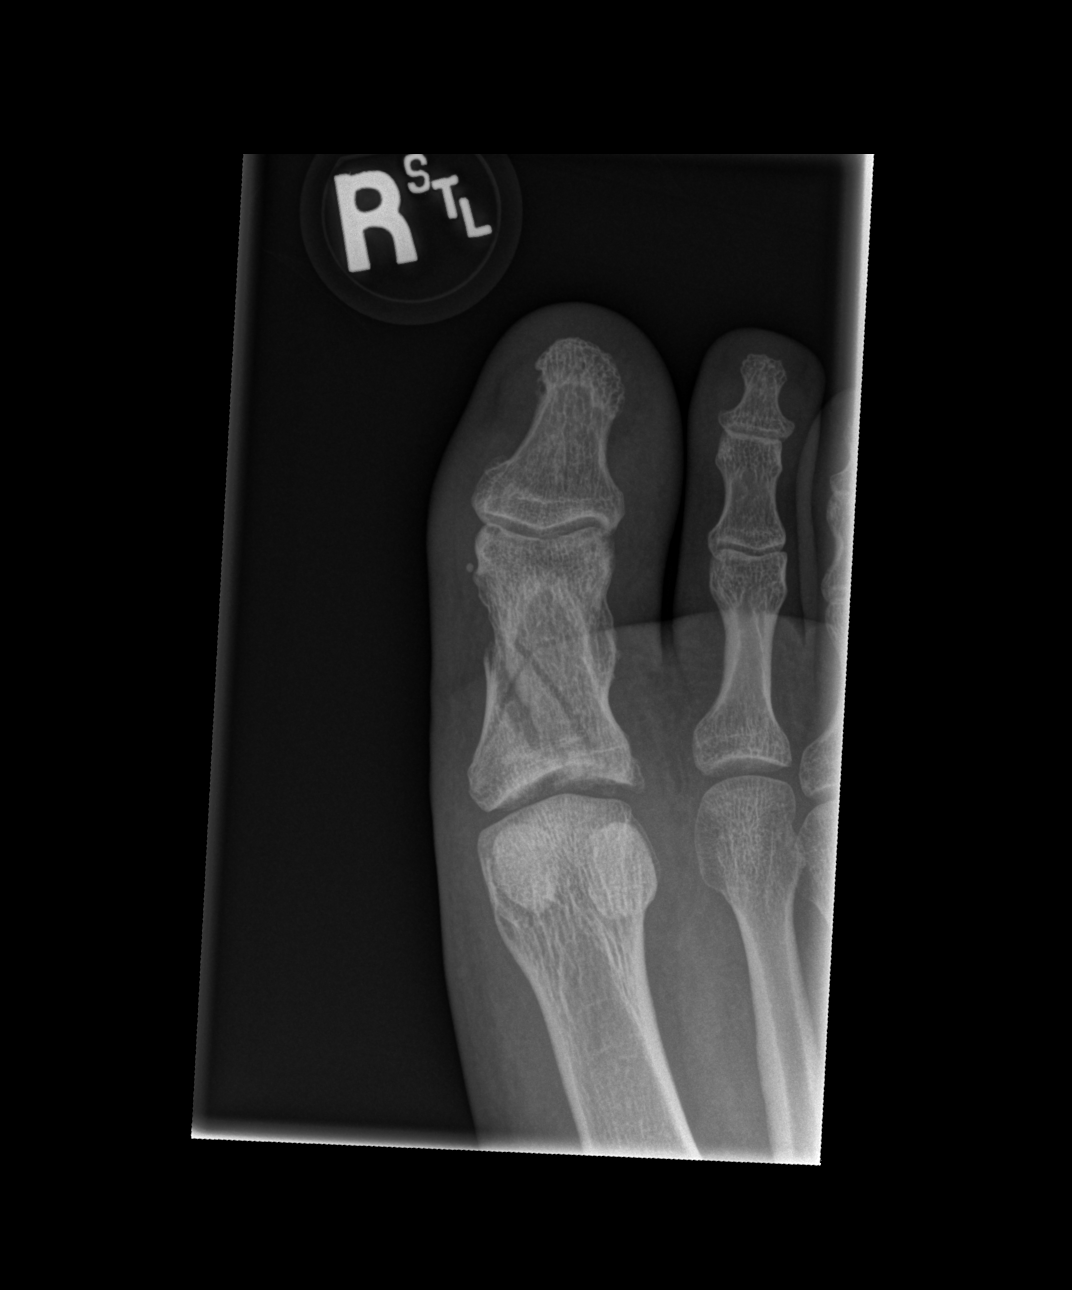

[x toes obl right]
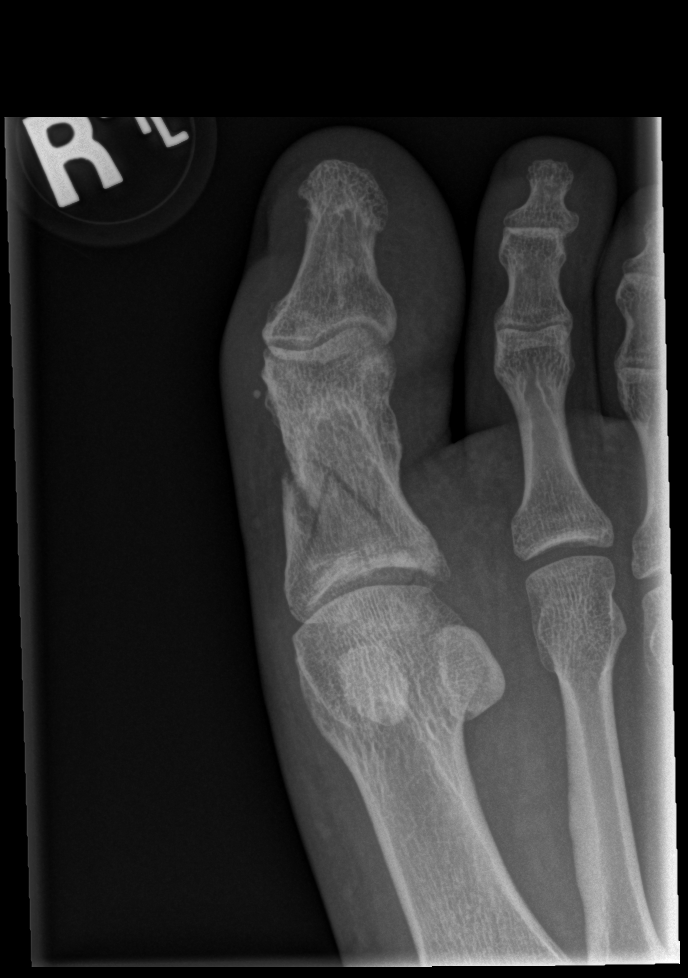

[x toes lat right]
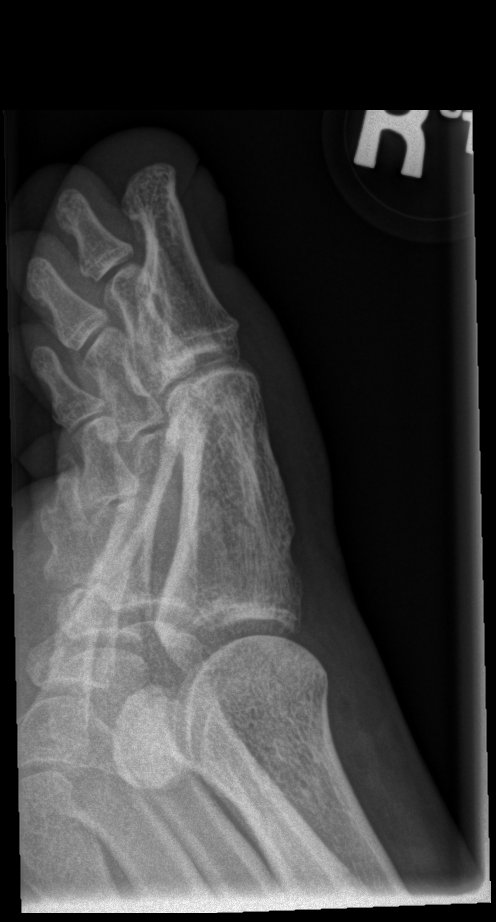

[3 of 3 positions shown; findings below may reference images not displayed]

FINDINGS: There is an acute, comminuted intra-articular fracture of the
proximal phalanx of the first digit. There is surrounding soft
tissue swelling. There is no dislocation.
IMPRESSION: Acute, comminuted intra-articular fracture of the proximal phalanx
of the first digit.
